# Patient Record
Sex: Female | Born: 1985 | Race: White | Hispanic: No | Smoking: Current some day smoker
Health system: Southern US, Community
[De-identification: ages and names within clinical notes are randomized; demographics above are authoritative.]

## PROBLEM LIST (undated history)

## (undated) DIAGNOSIS — IMO0002 Reserved for concepts with insufficient information to code with codable children: Secondary | ICD-10-CM

## (undated) DIAGNOSIS — F329 Major depressive disorder, single episode, unspecified: Secondary | ICD-10-CM

## (undated) HISTORY — PX: NO PAST SURGERIES: SHX2092

---
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
## 2012-06-15 ENCOUNTER — Observation Stay: Payer: Self-pay | Admitting: Obstetrics & Gynecology

## 2012-06-25 ENCOUNTER — Inpatient Hospital Stay: Payer: Self-pay

## 2012-06-25 LAB — CBC WITH DIFFERENTIAL/PLATELET
Basophil #: 0 10*3/uL (ref 0.0–0.1)
Eosinophil %: 0.8 %
HGB: 10.2 g/dL — ABNORMAL LOW (ref 12.0–16.0)
Lymphocyte %: 16.7 %
MCH: 26.9 pg (ref 26.0–34.0)
MCV: 78 fL — ABNORMAL LOW (ref 80–100)
Monocyte #: 1.1 x10 3/mm — ABNORMAL HIGH (ref 0.2–0.9)
Monocyte %: 7.1 %
Platelet: 256 10*3/uL (ref 150–440)
RBC: 3.8 10*6/uL (ref 3.80–5.20)
RDW: 14.7 % — ABNORMAL HIGH (ref 11.5–14.5)
WBC: 15.8 10*3/uL — ABNORMAL HIGH (ref 3.6–11.0)

## 2013-05-25 ENCOUNTER — Emergency Department: Payer: Self-pay | Admitting: Emergency Medicine

## 2013-10-23 LAB — HM PAP SMEAR: HM Pap smear: NEGATIVE

## 2014-05-03 ENCOUNTER — Emergency Department: Payer: Self-pay | Admitting: Emergency Medicine

## 2014-10-08 ENCOUNTER — Emergency Department: Payer: Self-pay | Admitting: Physician Assistant

## 2015-01-21 IMAGING — CT CT HEAD WITHOUT CONTRAST
1 series · 16 of 30 positions shown, 20 images · non-contrast
Comparison: None.

CLINICAL DATA: Facial spasm, numbness

EXAM:
CT HEAD WITHOUT CONTRAST
TECHNIQUE: Contiguous axial images were obtained from the base of the skull
through the vertex without intravenous contrast.

[Series 2: head wo · axial · 0.43mm/px · z∈[-145,-19]mm · 16 of 32 slices shown, 20 images]
[im 2/32  brain]
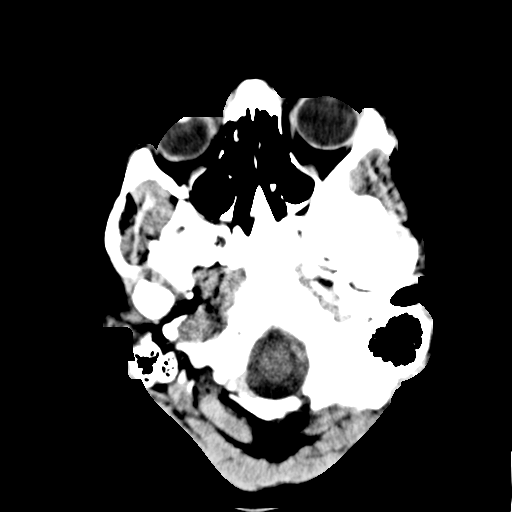
[im 2/32  bone]
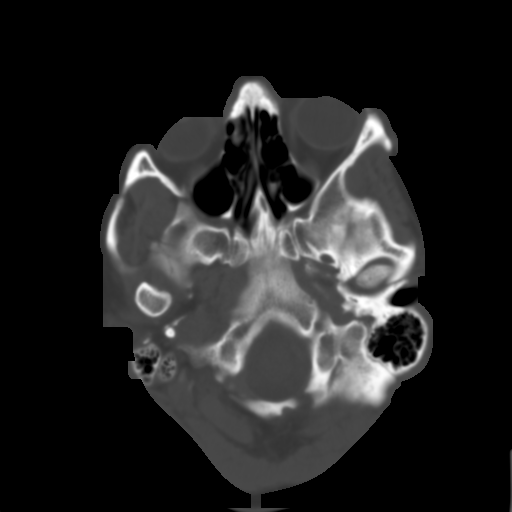
[im 4/32  brain]
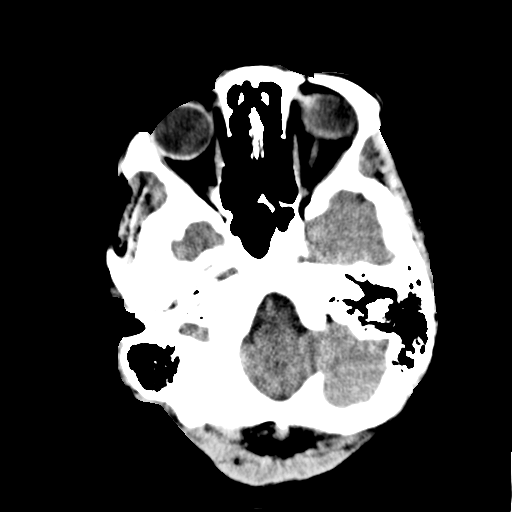
[im 6/32  brain]
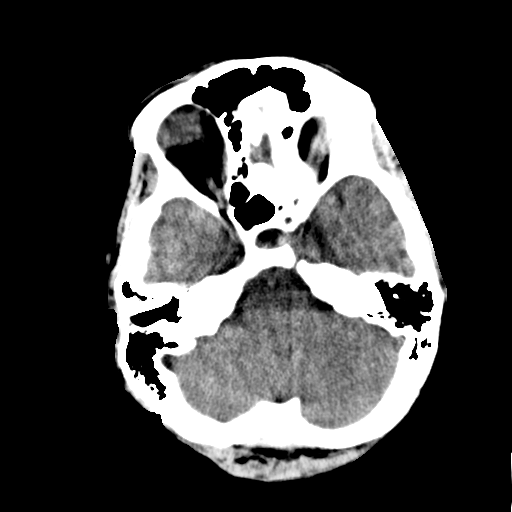
[im 8/32  brain]
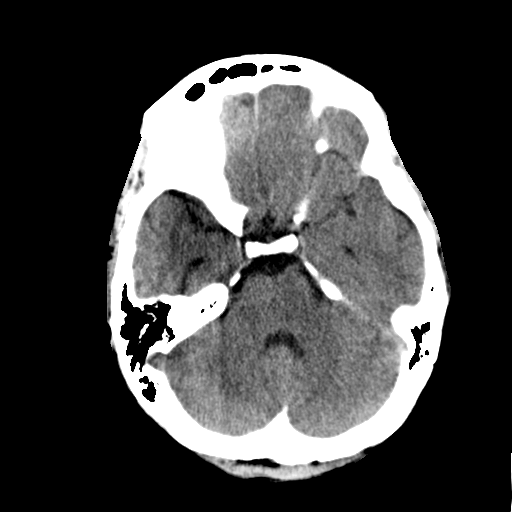
[im 9/32  brain]
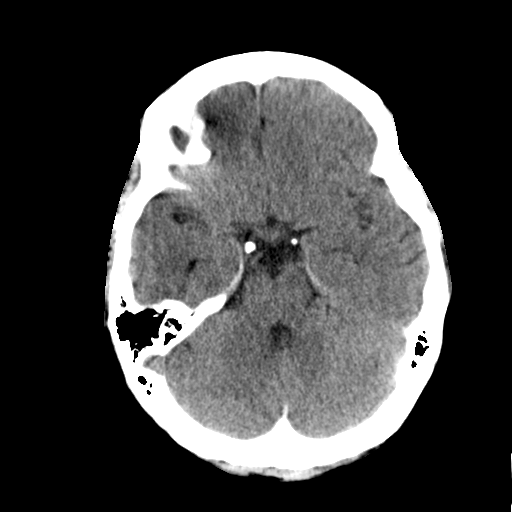
[im 9/32  bone]
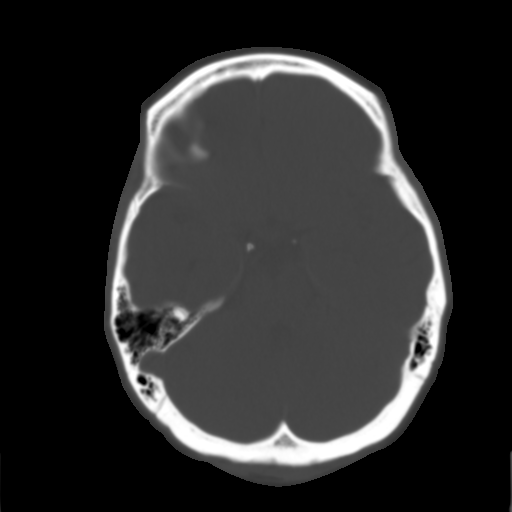
[im 11/32  brain]
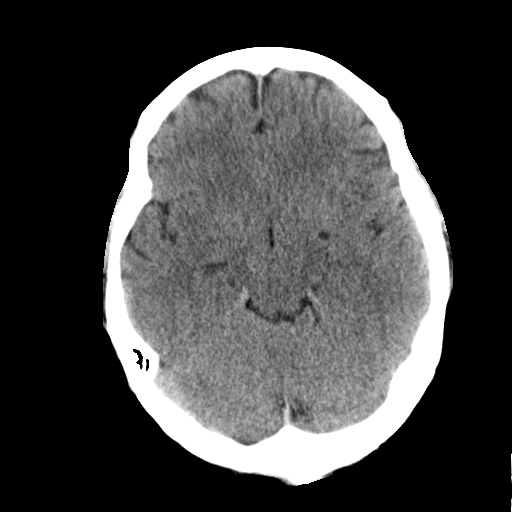
[im 13/32  brain]
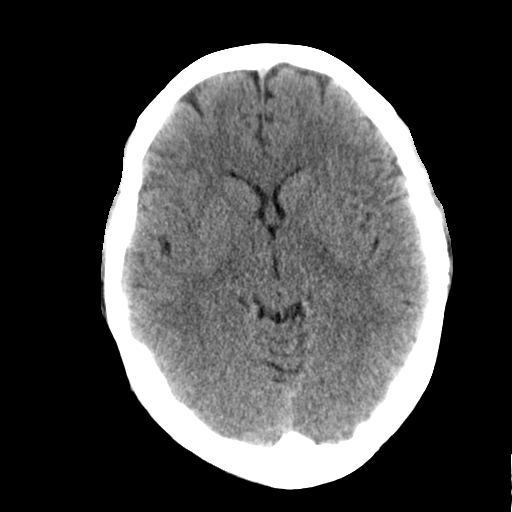
[im 15/32  brain]
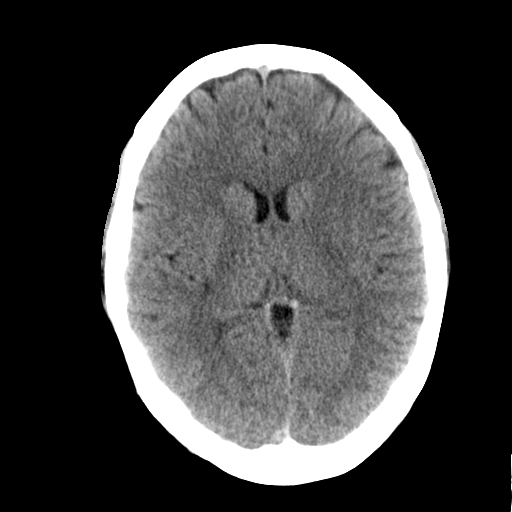
[im 17/32  brain]
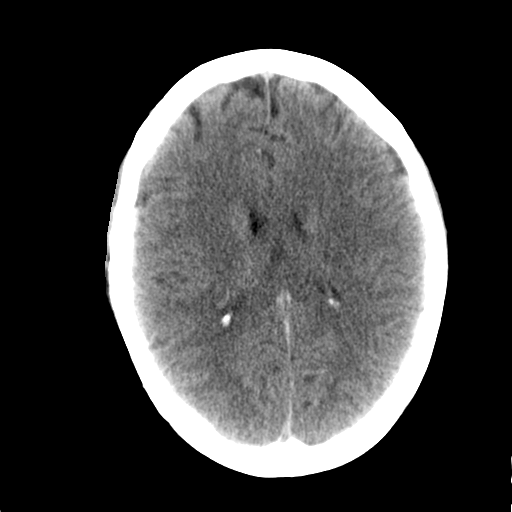
[im 17/32  bone]
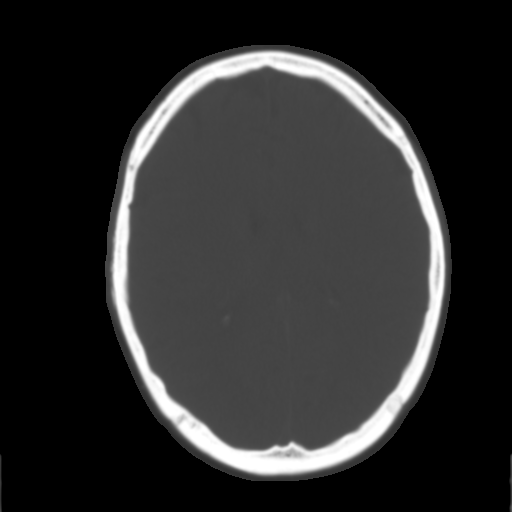
[im 19/32  brain]
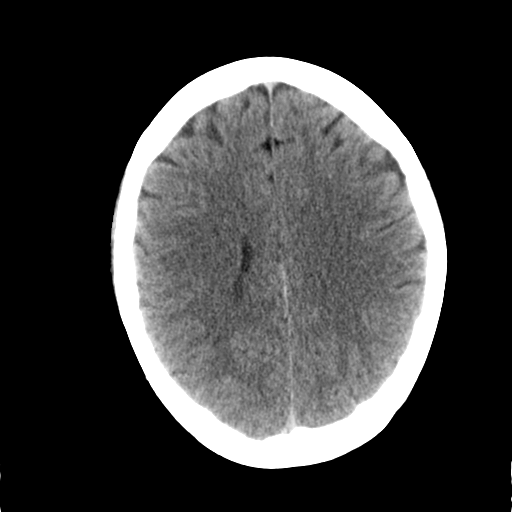
[im 21/32  brain]
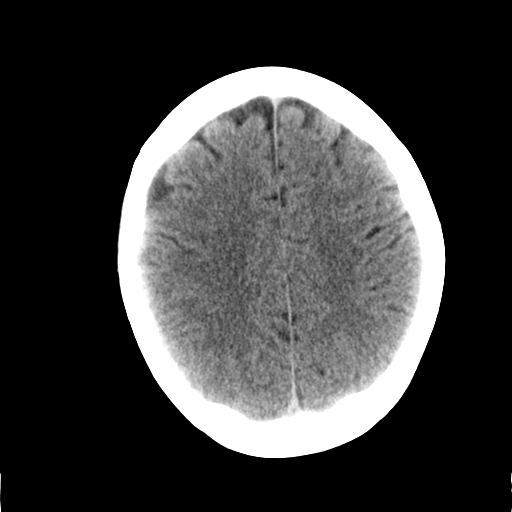
[im 23/32  brain]
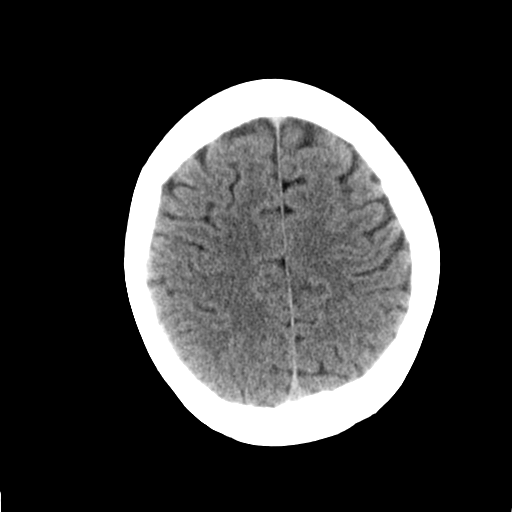
[im 24/32  brain]
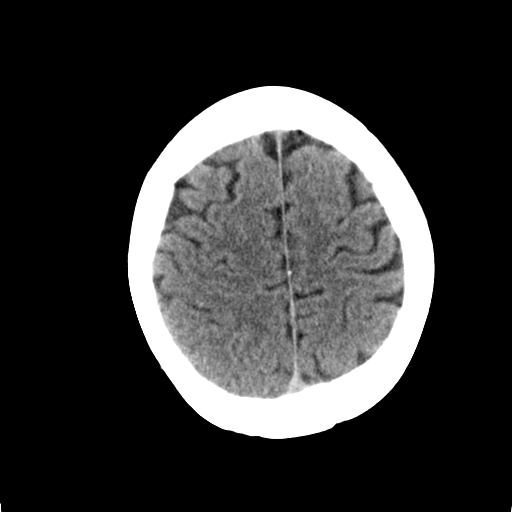
[im 24/32  bone]
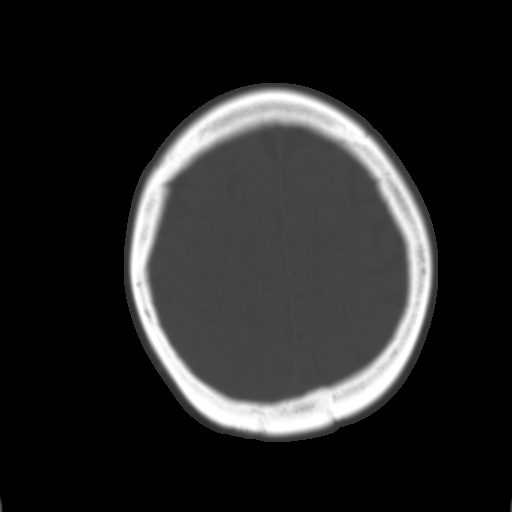
[im 26/32  brain]
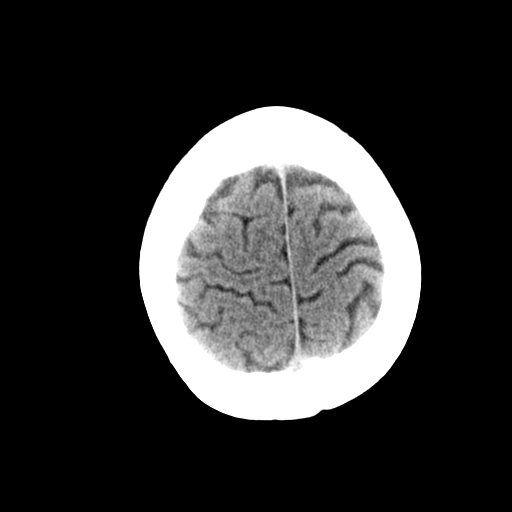
[im 28/32  brain]
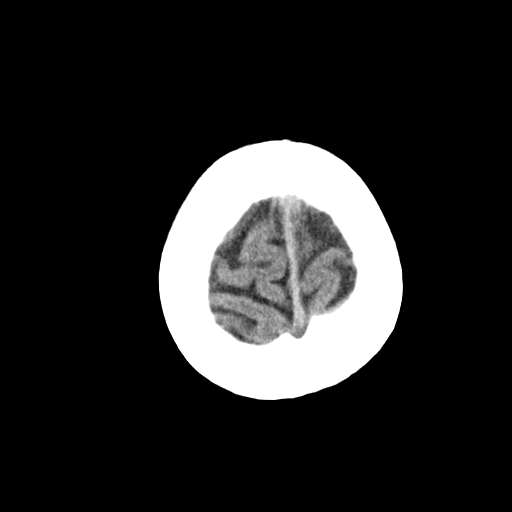
[im 30/32  brain]
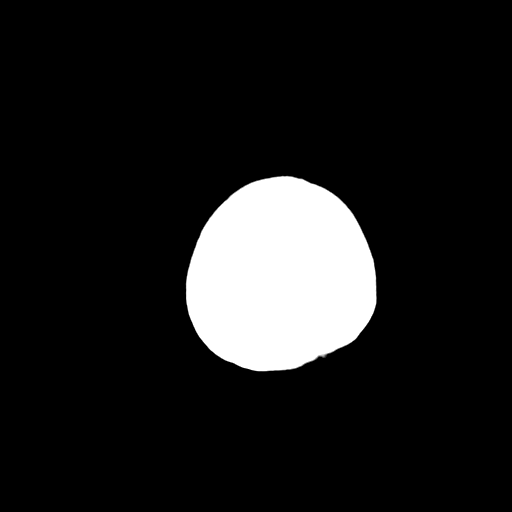

[16 of 30 positions shown; findings below may reference images not displayed]

FINDINGS: No skull fracture is noted. Paranasal sinuses and mastoid air cells
are unremarkable.

No intracranial hemorrhage, mass effect or midline shift.

No acute infarction. No mass lesion is noted on this unenhanced
scan. No hydrocephalus. The gray and white-matter differentiation is
preserved. No intra or extra-axial fluid collection.
IMPRESSION: No acute intracranial abnormality.

## 2015-01-30 NOTE — H&P (Signed)
L&D Evaluation:  History:   HPI 29yo G2P1001 @[redacted]w[redacted]d  by dates presents with c/o contractions for the past 12hrs ago.  2cm and membranes stripped in the office yesterday.  No LOF or VB.  +FM.  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  [facility] with [facility] @ 32w[facility].  PNL - O neg (Ab pos 04/28/12, s/p rhogram 04/14/12) HIV neg, VI, RI, Hgb 9.9, 1h GTT 117, GBS pos.    Presents with contractions    Patient's Medical History HSV    Patient's Surgical History none    Medications Pre Natal Vitamins  Valtrex    Allergies NKDA    Social History none    Family History Non-Contributory   ROS:   ROS All systems were reviewed.  HEENT, CNS, GI, GU, Respiratory, CV, Renal and Musculoskeletal systems were found to be normal.   Exam:   Vital Signs stable    General no apparent distress    Mental Status clear    Chest nl effort    Abdomen gravid, non-tender    Estimated Fetal Weight Average for gestational age    Fetal Position vtx    Pelvic 3 /70 / -2    Mebranes Intact    FHT normal rate with no decels, 145 moderate variability, + accels, no decels    Ucx irregular, every 3-2710min   Impression:   Impression early labor   Plan:   Plan monitor contractions and for cervical change   Electronic Signatures for Addendum Section:  Orvan FalconerStansbury Clipp, Kreston Ahrendt K (MD) (Signed Addendum 24-Sep-13 17:14)  Contractions spaced out, pt requesting d/c home.  Will f/u as scheduled next week.   Electronic Signatures: Garnette GunnerStansbury Clipp, Ali LoweEryn K (MD)  (Signed 24-Sep-13 16:30)  Authored: L&D Evaluation   Last Updated: 24-Sep-13 17:14 by Garnette GunnerStansbury Clipp, Ali LoweEryn K (MD)

## 2015-04-23 ENCOUNTER — Encounter: Payer: Self-pay | Admitting: Emergency Medicine

## 2015-04-23 ENCOUNTER — Emergency Department: Payer: Medicaid Other

## 2015-04-23 ENCOUNTER — Emergency Department
Admission: EM | Admit: 2015-04-23 | Discharge: 2015-04-23 | Disposition: A | Discharge status: Home / Self Care | Payer: Medicaid Other | Attending: Emergency Medicine | Admitting: Emergency Medicine

## 2015-04-23 DIAGNOSIS — O9989 Other specified diseases and conditions complicating pregnancy, childbirth and the puerperium: Secondary | ICD-10-CM | POA: Diagnosis present

## 2015-04-23 DIAGNOSIS — R197 Diarrhea, unspecified: Secondary | ICD-10-CM | POA: Diagnosis not present

## 2015-04-23 DIAGNOSIS — O200 Threatened abortion: Secondary | ICD-10-CM | POA: Insufficient documentation

## 2015-04-23 DIAGNOSIS — Z3A00 Weeks of gestation of pregnancy not specified: Secondary | ICD-10-CM | POA: Diagnosis not present

## 2015-04-23 DIAGNOSIS — O99331 Smoking (tobacco) complicating pregnancy, first trimester: Secondary | ICD-10-CM | POA: Diagnosis not present

## 2015-04-23 DIAGNOSIS — O210 Mild hyperemesis gravidarum: Secondary | ICD-10-CM | POA: Insufficient documentation

## 2015-04-23 DIAGNOSIS — F17210 Nicotine dependence, cigarettes, uncomplicated: Secondary | ICD-10-CM | POA: Insufficient documentation

## 2015-04-23 DIAGNOSIS — O219 Vomiting of pregnancy, unspecified: Secondary | ICD-10-CM

## 2015-04-23 LAB — COMPREHENSIVE METABOLIC PANEL
ALT: 25 U/L (ref 14–54)
ANION GAP: 8 (ref 5–15)
AST: 43 U/L — ABNORMAL HIGH (ref 15–41)
Albumin: 4.2 g/dL (ref 3.5–5.0)
Alkaline Phosphatase: 75 U/L (ref 38–126)
BUN: 7 mg/dL (ref 6–20)
CO2: 21 mmol/L — ABNORMAL LOW (ref 22–32)
CREATININE: 0.47 mg/dL (ref 0.44–1.00)
Calcium: 9 mg/dL (ref 8.9–10.3)
Chloride: 106 mmol/L (ref 101–111)
GFR calc Af Amer: >60 mL/min (ref >60)
Glucose, Bld: 72 mg/dL (ref 65–99)
Potassium: 3.7 mmol/L (ref 3.5–5.1)
Sodium: 135 mmol/L (ref 135–145)
Total Bilirubin: 0.3 mg/dL (ref 0.3–1.2)
Total Protein: 7.1 g/dL (ref 6.5–8.1)

## 2015-04-23 LAB — CBC
HEMATOCRIT: 41.7 % (ref 35.0–47.0)
Hemoglobin: 13.5 g/dL (ref 12.0–16.0)
MCH: 27.6 pg (ref 26.0–34.0)
MCHC: 32.5 g/dL (ref 32.0–36.0)
MCV: 85.2 fL (ref 80.0–100.0)
Platelets: 198 10*3/uL (ref 150–440)
RBC: 4.89 MIL/uL (ref 3.80–5.20)
RDW: 15.1 % — AB (ref 11.5–14.5)
WBC: 8.4 10*3/uL (ref 3.6–11.0)

## 2015-04-23 LAB — URINALYSIS COMPLETE WITH MICROSCOPIC (ARMC ONLY)
BILIRUBIN URINE: NEGATIVE
Bacteria, UA: NONE SEEN
GLUCOSE, UA: NEGATIVE mg/dL
Hgb urine dipstick: NEGATIVE
Ketones, ur: NEGATIVE mg/dL
Leukocytes, UA: NEGATIVE
NITRITE: NEGATIVE
PH: 5 (ref 5.0–8.0)
Protein, ur: NEGATIVE mg/dL
SPECIFIC GRAVITY, URINE: 1.019 (ref 1.005–1.030)

## 2015-04-23 LAB — TYPE AND SCREEN
ABO/RH(D): O NEG
ANTIBODY SCREEN: NEGATIVE

## 2015-04-23 LAB — ABO/RH: ABO/RH(D): O NEG

## 2015-04-23 LAB — HCG, QUANTITATIVE, PREGNANCY: hCG, Beta Chain, Quant, S: 7377 m[IU]/mL — ABNORMAL HIGH (ref <5)

## 2015-04-23 LAB — PREGNANCY, URINE: PREG TEST UR: POSITIVE — AB

## 2015-04-23 MED ORDER — ONDANSETRON HCL 4 MG/2ML IJ SOLN: 4.0000 mg | Freq: Once | INTRAMUSCULAR | Status: DC

## 2015-04-23 MED ORDER — RHO D IMMUNE GLOBULIN 1500 UNIT/2ML IJ SOSY
300.0000 ug | PREFILLED_SYRINGE | Freq: Once | INTRAMUSCULAR | Status: AC
  Administered 2015-04-23: 300 ug via INTRAMUSCULAR
  Filled 2015-04-23: qty 2

## 2015-04-23 MED ORDER — SODIUM CHLORIDE 0.9 % IV BOLUS (SEPSIS)
1000.0000 mL | Freq: Once | INTRAVENOUS | Status: AC
Start: 2015-04-23 — End: 2015-04-23
  Administered 2015-04-23: 1000 mL via INTRAVENOUS

## 2015-04-23 NOTE — ED Notes (Signed)
Called lab re: ABO/RH still has 15 minutes left to run.

## 2015-04-23 NOTE — ED Notes (Signed)
Patient transported to Ultrasound 

## 2015-04-23 NOTE — ED Notes (Signed)
Patient in ultrasound at this time.

## 2015-04-23 NOTE — Discharge Instructions (Signed)
You have been seen in the emergency department today for nausea, vomiting, diarrhea, and vaginal spotting during her pregnancy. Her ultrasound shows a 6 week intrauterine pregnancy. Please drink plenty of fluids at home, follow up with your OB/GYN as soon as possible. You have been dosed RhoGAM in the emergency department. Return to the emergency department for any pain, fever, or increased vaginal bleeding.     Morning Sickness Morning sickness is when you feel sick to your stomach (nauseous) during pregnancy. This nauseous feeling may or may not come with vomiting. It often occurs in the morning but can be a problem any time of day. Morning sickness is most common during the first trimester, but it may continue throughout pregnancy. While morning sickness is unpleasant, it is usually harmless unless you develop severe and continual vomiting (hyperemesis gravidarum). This condition requires more intense treatment.  CAUSES  The cause of morning sickness is not completely known but seems to be related to normal hormonal changes that occur in pregnancy. RISK FACTORS You are at greater risk if you:  Experienced nausea or vomiting before your pregnancy.  Had morning sickness during a previous pregnancy.  Are pregnant with more than one baby, such as twins. TREATMENT  Do not use any medicines (prescription, over-the-counter, or herbal) for morning sickness without first talking to your health care provider. Your health care provider may prescribe or recommend:  Vitamin B6 supplements.  Anti-nausea medicines.  The herbal medicine ginger. HOME CARE INSTRUCTIONS   Only take over-the-counter or prescription medicines as directed by your health care provider.  Taking multivitamins before getting pregnant can prevent or decrease the severity of morning sickness in most women.  Eat a piece of dry toast or unsalted crackers before getting out of bed in the morning.  Eat five or six small meals a  day.  Eat dry and bland foods (rice, baked potato). Foods high in carbohydrates are often helpful.  Do not drink liquids with your meals. Drink liquids between meals.  Avoid greasy, fatty, and spicy foods.  Get someone to cook for you if the smell of any food causes nausea and vomiting.  If you feel nauseous after taking prenatal vitamins, take the vitamins at night or with a snack.  Snack on protein foods (nuts, yogurt, cheese) between meals if you are hungry.  Eat unsweetened gelatins for desserts.  Wearing an acupressure wristband (worn for sea sickness) may be helpful.  Acupuncture may be helpful.  Do not smoke.  Get a humidifier to keep the air in your house free of odors.  Get plenty of fresh air. SEEK MEDICAL CARE IF:   Your home remedies are not working, and you need medicine.  You feel dizzy or lightheaded.  You are losing weight. SEEK IMMEDIATE MEDICAL CARE IF:   You have persistent and uncontrolled nausea and vomiting.  You pass out (faint). MAKE SURE YOU:  Understand these instructions.  Will watch your condition.  Will get help right away if you are not doing well or get worse. Document Released: 10/30/2006 Document Revised: 09/13/2013 Document Reviewed: 02/23/2013 [facility] Patient Information 2015 [place], [place]. This information is not intended to replace advice given to you by your health care provider. Make sure you discuss any questions you have with your health care provider.  Threatened Miscarriage A threatened miscarriage is when you have vaginal bleeding during your first 20 weeks of pregnancy but the pregnancy has not ended. Your doctor will do tests to make sure you are still  pregnant. The cause of the bleeding may not be known. This condition does not mean your pregnancy will end. It does increase the risk of it ending (complete miscarriage). HOME CARE   Make sure you keep all your doctor visits for prenatal care.  Get plenty of  rest.  Do not have sex or use tampons if you have vaginal bleeding.  Do not douche.  Do not smoke or use drugs.  Do not drink alcohol.  Avoid caffeine. GET HELP IF:  You have light bleeding from your vagina.  You have belly pain or cramping.  You have a fever. GET HELP RIGHT AWAY IF:   You have heavy bleeding from your vagina.  You have clots of blood coming from your vagina.  You have bad pain or cramps in your low back or belly.  You have fever, chills, and bad belly pain. MAKE SURE YOU:   Understand these instructions.  Will watch your condition.  Will get help right away if you are not doing well or get worse. Document Released: 08/21/2008 Document Revised: 09/13/2013 Document Reviewed: 07/05/2013 [facility] Patient Information 2015 [place], [place]. This information is not intended to replace advice given to you by your health care provider. Make sure you discuss any questions you have with your health care provider.

## 2015-04-23 NOTE — ED Notes (Signed)
Returned from ultrasound.

## 2015-04-23 NOTE — ED Notes (Signed)
Pt arrives with complaints of diaherra, stomach cramps, and nausea, pt states she is exactly 1 month pregnant, pt states some spotting vaginally, pt states she is unable to keep anything down, pt alert during assessment in no acute distress

## 2015-04-23 NOTE — ED Provider Notes (Signed)
[facility] Emergency Department Provider Note  Time seen: 9:18 AM  I have reviewed the triage vital signs and the nursing notes.   HISTORY  Chief Complaint Abdominal Pain    HPI Penny Zimmerman is a 29 y.o. female who is approximately 1-2 months pregnant presents the emergency department for lower abdominal pain, nausea, vomiting, diarrhea. According to the patient for the past 2 weeks she has had intermittent diarrhea, along with lower abdominal cramps/pain, persistent nausea with intermittent vomiting. She also states 2 days ago she had vaginal spotting which is since resolved. Denies any vaginal discharge. Patient's main complaint is not being able to keep anything down, feels dehydrated. Describes her nausea as moderate, diarrhea as moderate. Denies fever. Patient has not yet seen her OB.      History reviewed. No pertinent past medical history.  There are no active problems to display for this patient.   History reviewed. No pertinent past surgical history.  No current outpatient prescriptions on file.  Allergies Review of patient's allergies indicates no known allergies.  No family history on file.  Social History History  Substance Use Topics  . Smoking status: Current Some Day Smoker  . Smokeless tobacco: Not on file  . Alcohol Use: No    Review of Systems Constitutional: Negative for fever. Cardiovascular: Negative for chest pain. Respiratory: Negative for shortness of breath. Gastrointestinal: Positive for lower abdominal pain/cramping. Positive for nausea, vomiting, diarrhea. Skin: Negative for rash. Neurological: Negative for headache 10-point ROS otherwise negative.  ____________________________________________   PHYSICAL EXAM:  VITAL SIGNS: ED Triage Vitals  Enc Vitals Group     BP 04/23/15 0823 129/66 mmHg     Pulse Rate 04/23/15 0823 86     Resp 04/23/15 0823 18     Temp 04/23/15 0823 98.4 F (36.9 C)     Temp Source  04/23/15 0823 Oral     SpO2 04/23/15 0823 100 %     Weight 04/23/15 0823 180 lb (81.647 kg)     Height 04/23/15 0823  (1.727 m)     Head Cir --      Peak Flow --      Pain Score 04/23/15 0823 9     Pain Loc --      Pain Edu? --      Excl. in GC? --     Constitutional: Alert and oriented. Well appearing and in no distress. Eyes: Normal exam ENT   Mouth/Throat: Mucous membranes are moist. Cardiovascular: Normal rate, regular rhythm. No murmur Respiratory: Normal respiratory effort without tachypnea nor retractions. Breath sounds are clear and equal bilaterally. No wheezes/rales/rhonchi. Gastrointestinal: Mild lower abdominal tenderness palpation. No rebound or guarding. No distention. Musculoskeletal: Nontender with normal range of motion in all extremities Neurologic:  Normal speech and language. No gross focal neurologic deficits Skin:  Skin is warm, dry and intact.  Psychiatric: Mood and affect are normal. Speech and behavior are normal.   ____________________________________________   RADIOLOGY  Ultrasound consistent with single IUP at 6 weeks. Heart rate of 100 bpm.  ____________________________________________    INITIAL IMPRESSION / ASSESSMENT AND PLAN / ED COURSE  Pertinent labs & imaging results that were available during my care of the patient were reviewed by me and considered in my medical decision making (see chart for details).  Patient with 2 weeks of diarrhea, nausea, vomiting. She is approximately 1-2 months pregnant at this time. Also with vaginal spotting 2-3 days he have. We'll proceed with workup  including beta hCG, ultrasound, type and screen, IV hydration. I discussed using nausea medication such as Zofran or Phenergan, the patient does not feel comfortable taking these medications at this time, and would rather discuss this with her OB/GYN first.   Ultrasound consistent with early pregnancy at 6 weeks. We will discharge patient home with OB  follow-up. The patient does not wish for nausea medication going home. Patient is O- we will dose RhoGAM before going home.  ____________________________________________   FINAL CLINICAL IMPRESSION(S) / ED DIAGNOSES  Nausea and vomiting First trimester pregnancy Diarrhea Threatened miscarriage   Minna Antis, MD 04/23/15 [phone]

## 2015-04-23 NOTE — ED Notes (Signed)
Pt presents with abd pain and vomiting. Pt states she is app one mth pregnant. No vomiting noted in triage.

## 2015-04-24 LAB — RHOGAM INJECTION: Unit division: 0

## 2015-05-11 ENCOUNTER — Ambulatory Visit (INDEPENDENT_AMBULATORY_CARE_PROVIDER_SITE_OTHER): Payer: Medicaid Other | Admitting: Obstetrics and Gynecology

## 2015-05-11 VITALS — BP 110/71 | HR 75 | Wt 185.2 lb

## 2015-05-11 DIAGNOSIS — O360111 Maternal care for anti-D [Rh] antibodies, first trimester, fetus 1: Secondary | ICD-10-CM

## 2015-05-11 DIAGNOSIS — Z369 Encounter for antenatal screening, unspecified: Secondary | ICD-10-CM

## 2015-05-11 DIAGNOSIS — Z36 Encounter for antenatal screening of mother: Secondary | ICD-10-CM

## 2015-05-11 DIAGNOSIS — Z331 Pregnant state, incidental: Secondary | ICD-10-CM

## 2015-05-11 DIAGNOSIS — R638 Other symptoms and signs concerning food and fluid intake: Secondary | ICD-10-CM

## 2015-05-11 DIAGNOSIS — Z113 Encounter for screening for infections with a predominantly sexual mode of transmission: Secondary | ICD-10-CM

## 2015-05-11 DIAGNOSIS — Z1389 Encounter for screening for other disorder: Secondary | ICD-10-CM

## 2015-05-11 NOTE — Progress Notes (Signed)
Penny Zimmerman for NOB nurse interview visit. G-3.  P-2002. Pt seen in ER on 04/23/2015 for spotting and was given Rhogam injection. Also had dehydration from n/v but pt states this has improved and does not want anything for n/v. Pregnancy eduction material explained and given. No cats in the home. NOB labs ordered. TSH/HbgA1c due to Increased BMI, HIV and drug informed that she could opt out but did not. Drug screen sent to lab. PNV encouraged. NT to discuss with provider. Pt states she does have bad teeth and will need dental work.  Pt. To follow up with provider in 2 weeks for NOB physical.  All questions answered.  ZIKA EXPOSURE SCREEN:  The patient has not traveled to a [place] Virus endemic area within the past 6 months, nor has she had unprotected sex with a partner who has travelled to a [place] endemic region within the past 6 months. The patient has been advised to notify us if these factors change any time during this current pregnancy, so adequate testing and monitoring can be initiated.

## 2015-05-12 DIAGNOSIS — O26899 Other specified pregnancy related conditions, unspecified trimester: Secondary | ICD-10-CM | POA: Insufficient documentation

## 2015-05-12 DIAGNOSIS — Z6791 Unspecified blood type, Rh negative: Secondary | ICD-10-CM

## 2015-05-12 LAB — CBC WITH DIFFERENTIAL/PLATELET
Basophils Absolute: 0 10*3/uL (ref 0.0–0.2)
Basos: 0 %
EOS (ABSOLUTE): 0.1 10*3/uL (ref 0.0–0.4)
EOS: 2 %
HEMATOCRIT: 37.5 % (ref 34.0–46.6)
Hemoglobin: 12.8 g/dL (ref 11.1–15.9)
IMMATURE GRANS (ABS): 0 10*3/uL (ref 0.0–0.1)
IMMATURE GRANULOCYTES: 0 %
Lymphocytes Absolute: 2.1 10*3/uL (ref 0.7–3.1)
Lymphs: 28 %
MCH: 27.9 pg (ref 26.6–33.0)
MCHC: 34.1 g/dL (ref 31.5–35.7)
MCV: 82 fL (ref 79–97)
MONOS ABS: 0.6 10*3/uL (ref 0.1–0.9)
Monocytes: 8 %
NEUTROS PCT: 62 %
Neutrophils Absolute: 4.7 10*3/uL (ref 1.4–7.0)
Platelets: 240 10*3/uL (ref 150–379)
RBC: 4.58 x10E6/uL (ref 3.77–5.28)
RDW: 14.7 % (ref 12.3–15.4)
WBC: 7.6 10*3/uL (ref 3.4–10.8)

## 2015-05-12 LAB — URINALYSIS, ROUTINE W REFLEX MICROSCOPIC
Bilirubin, UA: NEGATIVE
Glucose, UA: NEGATIVE
KETONES UA: NEGATIVE
Leukocytes, UA: NEGATIVE
NITRITE UA: NEGATIVE
PH UA: 7.5 (ref 5.0–7.5)
Protein, UA: NEGATIVE
RBC, UA: NEGATIVE
SPEC GRAV UA: 1.006 (ref 1.005–1.030)
Urobilinogen, Ur: 0.2 mg/dL (ref 0.2–1.0)

## 2015-05-12 LAB — HEPATITIS B SURFACE ANTIGEN: Hepatitis B Surface Ag: NEGATIVE

## 2015-05-12 LAB — HEMOGLOBIN A1C
Est. average glucose Bld gHb Est-mCnc: 111 mg/dL
HEMOGLOBIN A1C: 5.5 % (ref 4.8–5.6)

## 2015-05-12 LAB — RPR: RPR: NONREACTIVE

## 2015-05-12 LAB — RUBELLA ANTIBODY, IGM: Rubella IgM: <20 AU/mL (ref 0.0–19.9)

## 2015-05-12 LAB — TSH: TSH: 1.04 u[IU]/mL (ref 0.450–4.500)

## 2015-05-12 LAB — HIV ANTIBODY (ROUTINE TESTING W REFLEX): HIV SCREEN 4TH GENERATION: NONREACTIVE

## 2015-05-12 NOTE — Progress Notes (Signed)
I have reviewed the record and concur with patient management and plan.  Abbegail Matuska, MD [facility]  

## 2015-05-13 LAB — GC/CHLAMYDIA PROBE AMP
Chlamydia trachomatis, NAA: NEGATIVE
NEISSERIA GONORRHOEAE BY PCR: NEGATIVE

## 2015-05-13 LAB — URINE CULTURE

## 2015-05-14 LAB — ANTIBODY SCREEN

## 2015-05-14 LAB — AB SCR+ANTIBODY ID: ANTIBODY SCREEN: POSITIVE — AB

## 2015-05-15 LAB — VARICELLA ZOSTER ANTIBODY, IGM: VARICELLA IGM: 0.98 {index} — AB (ref 0.00–0.90)

## 2015-05-21 ENCOUNTER — Encounter: Payer: Self-pay | Admitting: Obstetrics and Gynecology

## 2015-05-29 ENCOUNTER — Ambulatory Visit (INDEPENDENT_AMBULATORY_CARE_PROVIDER_SITE_OTHER): Payer: Medicaid Other | Admitting: Obstetrics and Gynecology

## 2015-05-29 ENCOUNTER — Encounter: Payer: Self-pay | Admitting: Obstetrics and Gynecology

## 2015-05-29 VITALS — BP 111/68 | HR 87 | Wt 187.5 lb

## 2015-05-29 DIAGNOSIS — O360111 Maternal care for anti-D [Rh] antibodies, first trimester, fetus 1: Secondary | ICD-10-CM

## 2015-05-29 DIAGNOSIS — Z3481 Encounter for supervision of other normal pregnancy, first trimester: Secondary | ICD-10-CM | POA: Diagnosis not present

## 2015-05-29 DIAGNOSIS — N898 Other specified noninflammatory disorders of vagina: Secondary | ICD-10-CM

## 2015-05-29 DIAGNOSIS — Z3491 Encounter for supervision of normal pregnancy, unspecified, first trimester: Secondary | ICD-10-CM

## 2015-05-29 DIAGNOSIS — Z3493 Encounter for supervision of normal pregnancy, unspecified, third trimester: Secondary | ICD-10-CM | POA: Insufficient documentation

## 2015-05-29 LAB — POCT URINALYSIS DIPSTICK
Bilirubin, UA: NEGATIVE
Glucose, UA: NEGATIVE
KETONES UA: NEGATIVE
Leukocytes, UA: NEGATIVE
Nitrite, UA: NEGATIVE
PH UA: 7.5
Protein, UA: NEGATIVE
Spec Grav, UA: <=1.005
Urobilinogen, UA: NEGATIVE

## 2015-05-29 NOTE — Progress Notes (Signed)
Subjective:    Penny Zimmerman is being seen today for her first obstetrical visit.  This is a planned pregnancy. She is a 29 y.o. G100P2002 female at 68w1dgestation. Patient's last menstrual period was 02/25/2015 (approximate). Estimated Date of Delivery: 12/17/15 (by 6 week ER sono consistent with dates). Her obstetrical history is significant for smoker and Rh negative status. Patient was seen in the ER at [redacted] weeks gestation for vaginal spotting, given dose of Rhogam. Relationship with FOB: spouse, living together. Patient does intend to breast feed. Pregnancy history fully reviewed.  Menstrual History: Obstetric History   G3   P2   T2   P0   A0   TAB0   SAB0   E0   M0   L2     # Outcome Date GA Lbr Len/2nd Weight Sex Delivery Anes PTL Lv  3 Current           2 Term 04/26/12   8 lb 2 oz (3.685 kg) M Vag-Spont   Y  1 Term 04/24/10   7 lb 3 oz (3.26 kg) M Vag-Spont   Y     Menarche age: [age]'s last menstrual period was 02/25/2015 (approximate).  Denies h/o STis or abnormal pap smears.  Last pap was unsure (possibly last year).    History reviewed. No pertinent past medical history.   Past Surgical History  Procedure Laterality Date  . No past surgeries      Family History  Problem Relation Age of Onset  . Diabetes Father   . Diabetes Paternal Grandmother   . Hypertension Father   . Hyperlipidemia Father      Social History   Social History  . Marital Status: Divorced    Spouse Name: N/A  . Number of Children: N/A  . 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 Smoking status: Current Some Day Smoker  . Smokeless tobacco: Never Used  . Alcohol Use: No  . Drug Use: No  . Sexual Activity:    Partners: Male   Other Topics Concern  . Not on file   Social History Narrative   Current Outpatient Prescriptions on File Prior to Visit  Medication Sig Dispense Refill  . acetaminophen (TYLENOL) 325 MG tablet Take  650 mg by mouth every 6 (six) hours as needed.    . Prenatal Vit-Fe Fumarate-FA (PRENATAL MULTIVITAMIN) TABS tablet Take 1 tablet by mouth daily at 12 noon.     No current facility-administered medications on file prior to visit.   No Known Allergies   Review of Systems General:Not Present- Fever, Weight Loss and Weight Gain. Skin:Not Present- Rash. HEENT:Not Present- Blurred Vision, Headache and Bleeding Gums. Respiratory:Not Present- Difficulty Breathing. Breast:Not Present- Breast Mass. Cardiovascular:Not Present- Chest Pain, Elevated Blood Pressure, Fainting / Blacking Out and Shortness of Breath. Gastrointestinal:Present - Nausea and Vomiting (mild). Not Present- Abdominal Pain, Constipation. Female Genitourinary:Not Present- Frequency, Painful Urination, Pelvic Pain, Vaginal Bleeding, Vaginal Discharge, Contractions, regular, Fetal Movements Decreased, Urinary Complaints and Vaginal Fluid. Musculoskeletal:Not Present- Back Pain and Leg Cramps. Neurological:Not Present- Dizziness. Psychiatric:Not Present- Depression.     Objective:  Blood pressure 111/68, pulse 87, weight 187 lb 8 oz (85.049 kg), last menstrual period 02/25/2015.  General Appearance:    Alert, cooperative, no distress, appears stated age  Head:    Normocephalic, without obvious abnormality, atraumatic  Eyes:    PERRL, conjunctiva/corneas clear, EOM's intact,  both eyes  Ears:    Normal external ear canals, both ears  Nose:   Nares normal, septum midline, mucosa normal, no drainage or sinus tenderness  Throat:   Lips, mucosa, and tongue normal; teeth and gums normal  Neck:   Supple, symmetrical, trachea midline, no adenopathy; thyroid: no enlargement/tenderness/nodules; no carotid bruit or JVD  Back:     Symmetric, no curvature, ROM normal, no CVA tenderness  Lungs:     Clear to auscultation bilaterally, respirations unlabored  Chest Wall:    No tenderness or deformity   Heart:    Regular rate and  rhythm, S1 and S2 normal, no murmur, rub or gallop  Breast Exam:    No tenderness, masses, or nipple abnormality  Abdomen:     Soft, non-tender, bowel sounds active all four quadrants, no masses, no organomegaly.  FHT 176  bpm.  Genitalia:    Pelvic:external genitalia normal; vagina without lesions, discharge, or tenderness; rectovaginal septum  normal. Cervix normal in appearance, no cervical motion tenderness; no adnexal masses or tenderness.  Pregnancy positive findings: uterine enlargement: 12 wk size, nontender.   Rectal:    Normal external sphincter.  No hemorrhoids appreciated. Internal exam not done.   Extremities:   Extremities normal, atraumatic, no cyanosis or edema  Pulses:   2+ and symmetric all extremities  Skin:   Skin color, texture, turgor normal, no rashes or lesions  Lymph nodes:   Cervical, supraclavicular, and axillary nodes normal  Neurologic:   CNII-XII intact, normal strength, sensation and reflexes throughout    Assessment:    Pregnancy at 11 and 1/7 weeks   Rh negative status in pregnancy Rubella non-immune Excessive weight gain in 1st trimester (12 lbs)   Plan:    Initial labs reviewed.  Pap smear performed.  Will need MMR vaccine postpartum.  Will need Rhogam at [redacted] weeks gestation.  Prenatal vitamins. Problem list reviewed and updated. Prenatal testing, optional genetic testing discussed. AFP3 discussed: declined. Role of ultrasound in pregnancy discussed; fetal survey: discussed, will order at appropriate gestational age.  New OB counseling: The patient has been given an overview regarding routine prenatal care. Recommendations regarding diet, weight gain, and exercise in pregnancy were given. Benefits of Breast Feeding were discussed. The patient is encouraged to consider nursing her baby post partum. Follow up in 4 weeks.   Rubie Maid, MD [facility]

## 2015-06-03 LAB — PAP IG, CT-NG, RFX HPV ASCU: PAP SMEAR COMMENT: 0

## 2015-06-04 ENCOUNTER — Telehealth: Payer: Self-pay

## 2015-06-04 LAB — NUSWAB VAGINITIS PLUS (VG+)
BVAB 2: HIGH {score} — AB
Megasphaera 1: HIGH Score — AB

## 2015-06-04 NOTE — Telephone Encounter (Signed)
Called pt, LM for her to call back

## 2015-06-04 NOTE — Telephone Encounter (Signed)
-----   Message from Anika Cherry, MD sent at 06/04/2015 11:05 AM EDT ----- Please inform patient of high grade pap smear.  Will need to be scheduled for colposcopy. Reassure patient this ok to perform during pregnancy if she asks, and send pamphlet on colposcopy procedure. 

## 2015-06-04 NOTE — Telephone Encounter (Signed)
Pt calls back, advised her that she needs colop. PT scheduled. Pt education mailed.

## 2015-06-05 ENCOUNTER — Telehealth: Payer: Self-pay

## 2015-06-05 NOTE — Telephone Encounter (Signed)
-----   Message from Hildred Laser, MD sent at 06/04/2015 11:05 AM EDT ----- Please inform patient of high grade pap smear.  Will need to be scheduled for colposcopy. Reassure patient this ok to perform during pregnancy if she asks, and send pamphlet on colposcopy procedure.

## 2015-06-05 NOTE — Telephone Encounter (Signed)
Pt informed of abnormal PAP and the need for colposcopy. PT education mailed. PT scheduled for colpo.

## 2015-06-12 ENCOUNTER — Ambulatory Visit (INDEPENDENT_AMBULATORY_CARE_PROVIDER_SITE_OTHER): Payer: Medicaid Other | Admitting: Obstetrics and Gynecology

## 2015-06-12 ENCOUNTER — Encounter: Payer: Self-pay | Admitting: Obstetrics and Gynecology

## 2015-06-12 VITALS — BP 132/79 | HR 89 | Wt 187.5 lb

## 2015-06-12 DIAGNOSIS — R87619 Unspecified abnormal cytological findings in specimens from cervix uteri: Secondary | ICD-10-CM

## 2015-06-12 DIAGNOSIS — IMO0002 Reserved for concepts with insufficient information to code with codable children: Secondary | ICD-10-CM | POA: Insufficient documentation

## 2015-06-12 DIAGNOSIS — O3441 Maternal care for other abnormalities of cervix, first trimester: Secondary | ICD-10-CM | POA: Insufficient documentation

## 2015-06-12 DIAGNOSIS — Z3492 Encounter for supervision of normal pregnancy, unspecified, second trimester: Secondary | ICD-10-CM

## 2015-06-12 DIAGNOSIS — R87613 High grade squamous intraepithelial lesion on cytologic smear of cervix (HGSIL): Secondary | ICD-10-CM

## 2015-06-12 HISTORY — DX: Reserved for concepts with insufficient information to code with codable children: IMO0002

## 2015-06-12 LAB — POCT URINALYSIS DIPSTICK
BILIRUBIN UA: NEGATIVE
Glucose, UA: NEGATIVE
Ketones, UA: NEGATIVE
Leukocytes, UA: NEGATIVE
Nitrite, UA: NEGATIVE
PH UA: 6
PROTEIN UA: NEGATIVE
RBC UA: NEGATIVE
Spec Grav, UA: 1.02
Urobilinogen, UA: NEGATIVE

## 2015-06-12 NOTE — Progress Notes (Signed)
Pt here for colposcopy for abnormal pap.

## 2015-06-12 NOTE — Progress Notes (Signed)
    GYNECOLOGY CLINIC COLPOSCOPY PROCEDURE NOTE  29 y.o. 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 Discussed role for HPV in cervical dysplasia, need for surveillance.  Patient is currently 13.[redacted] weeks pregnant.   Patient given verbal informed consent.  Placed in lithotomy position. Cervix viewed with speculum and colposcope after application of acetic acid.   Colposcopy adequate? Yes  no visible lesions and no abnormal vasculature; Multiple nabothian cysts present with mucus. No biopsies obtained.  ECC specimen obtained with cytobrush. Specimen were labelled and sent to pathology.  Patient was given post procedure instructions.  Will follow up pathology and manage accordingly.  Routine preventative health maintenance measures emphasized.  Will repeat colposcopy again postpartum.      Hildred Laser, MD [facility]

## 2015-06-14 ENCOUNTER — Telehealth: Payer: Self-pay

## 2015-06-14 NOTE — Telephone Encounter (Signed)
Pt had returned call early and I was with a patient. Left message to return call.

## 2015-06-14 NOTE — Telephone Encounter (Signed)
Pt calling stating her legs feel like jello, dizzy. She ate a banana before she went to work. Pt denies pain. Having a little vaginal spotting from exam (colposcopy a few days ago). I encouraged pt to eat carbs/protein, significant breakfast or snack and drink a coke that possibly this is due to her not eating enough and not enough fluids. Also enouraged her to lie down and to call me back in or so to let me know how she is feeling.

## 2015-06-15 LAB — PATHOLOGY

## 2015-06-21 ENCOUNTER — Telehealth: Payer: Self-pay | Admitting: Obstetrics and Gynecology

## 2015-06-21 ENCOUNTER — Telehealth: Payer: Self-pay

## 2015-06-21 MED ORDER — METRONIDAZOLE 500 MG PO TABS: 500.0000 mg | ORAL_TABLET | Freq: Two times a day (BID) | ORAL | Status: DC

## 2015-06-21 NOTE — Telephone Encounter (Signed)
-----   Message from Hildred Laser, MD sent at 06/20/2015 11:27 AM EDT ----- Please inform patient of BV infection.  Needs treatment with Flagyl 500 mg BID x 7 days.

## 2015-06-21 NOTE — Telephone Encounter (Signed)
Pt aware. Meds erx.  

## 2015-06-21 NOTE — Telephone Encounter (Signed)
I'm not sure I understand.  What is the purpose for the ultrasound? Is it for her anatomy scan?

## 2015-06-21 NOTE — Telephone Encounter (Signed)
PT IS COMING ON 10/4 AND WANTS TO KNOW IF YOU CAN ORDER HER AN US PELVIC FOR HER WORK PURPOSES. THEY LIKE TO KNOW 2 MOS AHEAD OF TIME.

## 2015-06-25 ENCOUNTER — Encounter: Payer: Self-pay | Admitting: Obstetrics and Gynecology

## 2015-06-25 NOTE — Telephone Encounter (Signed)
I called Penny Zimmerman and yes it is her Anatomy Scan I think in 4-5 wks.

## 2015-06-25 NOTE — Telephone Encounter (Signed)
I called pt as f/u due to no phone call received about her legs feeling like jelly and did advice given help and pt said yes.

## 2015-06-26 ENCOUNTER — Ambulatory Visit (INDEPENDENT_AMBULATORY_CARE_PROVIDER_SITE_OTHER): Payer: Medicaid Other | Admitting: Obstetrics and Gynecology

## 2015-06-26 VITALS — BP 119/68 | HR 80 | Wt 189.5 lb

## 2015-06-26 DIAGNOSIS — N760 Acute vaginitis: Secondary | ICD-10-CM

## 2015-06-26 DIAGNOSIS — O99342 Other mental disorders complicating pregnancy, second trimester: Secondary | ICD-10-CM

## 2015-06-26 DIAGNOSIS — A499 Bacterial infection, unspecified: Secondary | ICD-10-CM

## 2015-06-26 DIAGNOSIS — F329 Major depressive disorder, single episode, unspecified: Secondary | ICD-10-CM

## 2015-06-26 DIAGNOSIS — Z3492 Encounter for supervision of normal pregnancy, unspecified, second trimester: Secondary | ICD-10-CM

## 2015-06-26 DIAGNOSIS — N871 Moderate cervical dysplasia: Secondary | ICD-10-CM

## 2015-06-26 DIAGNOSIS — F32A Depression, unspecified: Secondary | ICD-10-CM

## 2015-06-26 DIAGNOSIS — B9689 Other specified bacterial agents as the cause of diseases classified elsewhere: Secondary | ICD-10-CM

## 2015-06-26 LAB — POCT URINALYSIS DIP (MANUAL ENTRY)
BILIRUBIN UA: NEGATIVE
Blood, UA: NEGATIVE
GLUCOSE UA: NEGATIVE
Ketones, POC UA: NEGATIVE
Leukocytes, UA: NEGATIVE
Nitrite, UA: NEGATIVE
Protein Ur, POC: NEGATIVE
Urobilinogen, UA: 0.2
pH, UA: 6

## 2015-06-26 MED ORDER — ENLYTE PO CAPS: 1.0000 | ORAL_CAPSULE | Freq: Every day | ORAL | Status: DC

## 2015-06-26 NOTE — Patient Instructions (Signed)

## 2015-06-26 NOTE — Progress Notes (Signed)
Patient denies FLU Shot today

## 2015-06-26 NOTE — Progress Notes (Signed)
ROB: Patient complains of feeling depressed over the past week.  Denies any recent stressors.  Notes changes in mood, difficulty sleeping (alternating insomnia and hypersomnia, and difficulty concentrating). Offered psychotherapy, and trial of Enlyte.  Patient declines antidepressant therapy at this time.  Also discussed ECC results from colposcopy (moderate dysplasia, CIN II-III).  Will monitor and repeat pap and colposcopy postpartum.  Is taking Flagyl for BV infection. Declines flu vaccine. RTC in 4 weeks.  For anatomy scan and serum AFP at that time.

## 2015-06-26 NOTE — Telephone Encounter (Signed)
Ok.  Well she was seen today. The order should be in and I think we got her scheduled today, but just double check behind me to be sure.  Hildred Laser, MD

## 2015-06-27 ENCOUNTER — Telehealth: Payer: Self-pay | Admitting: Obstetrics and Gynecology

## 2015-06-27 NOTE — Telephone Encounter (Signed)
Please let patient know that anything else that could be called in would be an anti-depressant medication, based on what Medicaid covers.  If she is willing to be started on an anti-depressant I will call her something in.

## 2015-06-27 NOTE — Telephone Encounter (Signed)
Penny Zimmerman went to get her antidepressants and Medicaid doesn't cover that one. It wouldvcost $180.00. Can you send something else that medicaid covers.

## 2015-06-29 ENCOUNTER — Telehealth: Payer: Self-pay | Admitting: Obstetrics and Gynecology

## 2015-06-29 NOTE — Telephone Encounter (Signed)
PT CALLED AND SPOKE WITH ROBIN ABOUT THE ENLITE, MEDICAID DOES NOT COVER AND SHE TALKED TO YOU AND YOU SAID YOU CAN CALL IN A DIFFERENT RX, AND SHE WOULD LIKE THE MOOD BOOSTER BUT PT WANTED A ANTIDEPRESSANT, AND THAT WHAT SHE WOULD LIKE.

## 2015-07-03 MED ORDER — SERTRALINE HCL 100 MG PO TABS: 100.0000 mg | ORAL_TABLET | Freq: Every day | ORAL | Status: DC

## 2015-07-03 NOTE — Telephone Encounter (Signed)
Please inform patient that I will start her on Zoloft for her depression.

## 2015-07-03 NOTE — Telephone Encounter (Signed)
PT AWARE  

## 2015-07-25 ENCOUNTER — Encounter: Payer: Medicaid Other | Admitting: Obstetrics and Gynecology

## 2015-07-25 ENCOUNTER — Ambulatory Visit: Payer: Medicaid Other

## 2015-07-25 DIAGNOSIS — Z3492 Encounter for supervision of normal pregnancy, unspecified, second trimester: Secondary | ICD-10-CM

## 2015-08-29 ENCOUNTER — Ambulatory Visit (INDEPENDENT_AMBULATORY_CARE_PROVIDER_SITE_OTHER): Payer: Medicaid Other | Admitting: Obstetrics and Gynecology

## 2015-08-29 VITALS — BP 126/75 | HR 96 | Wt 198.2 lb

## 2015-08-29 DIAGNOSIS — Z131 Encounter for screening for diabetes mellitus: Secondary | ICD-10-CM

## 2015-08-29 DIAGNOSIS — O360121 Maternal care for anti-D [Rh] antibodies, second trimester, fetus 1: Secondary | ICD-10-CM

## 2015-08-29 DIAGNOSIS — Z3482 Encounter for supervision of other normal pregnancy, second trimester: Secondary | ICD-10-CM

## 2015-08-29 DIAGNOSIS — Z3492 Encounter for supervision of normal pregnancy, unspecified, second trimester: Secondary | ICD-10-CM

## 2015-08-29 LAB — POCT URINALYSIS DIPSTICK
BILIRUBIN UA: NEGATIVE
GLUCOSE UA: NEGATIVE
Ketones, UA: NEGATIVE
LEUKOCYTES UA: NEGATIVE
NITRITE UA: NEGATIVE
Protein, UA: NEGATIVE
Spec Grav, UA: <=1.005
UROBILINOGEN UA: NEGATIVE
pH, UA: 8.5

## 2015-08-29 NOTE — Progress Notes (Signed)
ROB: Reports complaints of pelvic pressure.  Denies contractions. Notes that she discontinued her Zoloft due to feeling better.  Normal anatomy scan last visit. Did not get serum AFP completed.  RTC in 4 weeks.  For 28 week labs, Tdap, Rhogam, and blood consents at that time.

## 2015-09-23 NOTE — L&D Delivery Note (Signed)
Delivery Summary for Penny Zimmerman  Labor Events:   Preterm labor:   Rupture date:   Rupture time:   Rupture type: Spontaneous  Fluid Color:   Induction:   Augmentation:   Complications:   Cervical ripening:          Delivery:   Episiotomy:   Lacerations:   Repair suture:   Repair # of packets:   Blood loss (ml): 100    Information for the patient's newbornPecola Lawless:  Stephens, Boy Monta [[id number]]    Delivery 12/13/2015 11:30 AM by  Vaginal, Spontaneous Delivery Sex:  female Gestational Age: 6557w3d Delivery Clinician:  Hildred LaserAnika Shruthi Northrup Living?: Yes        APGARS  One minute Five minutes Ten minutes  Skin color: 1   2      Heart rate: 2   2      Grimace: 2   2      Muscle tone: 2   2      Breathing: 2   2      Totals: 9  10      Presentation/position: Vertex     Resuscitation: None  Cord information: 3 vessels   Disposition of cord blood: No    Blood gases sent? No Complications: None  Placenta: Delivered: 12/13/2015 11:36 AM  Spontaneous  Intact appearance Newborn Measurements: Weight: 6 lb 7 oz (2920 g)  Height: 19.09"  Head circumference: 32 cm  Chest circumference: 32 cm  Other providers:   Vale HavenJamie A Coble Tiffany D [place]  Additional  information: Forceps:   Vacuum:   Breech:   Observed anomalies        Delivery Note At 11:30 AM a viable and healthy female was delivered via Vaginal, Spontaneous Delivery (Presentation:  Vertex; Left Occiput Anterior).  APGAR: 9, 10; weight 6 lb 7 oz (2920 g).   Placenta status: Intact, Spontaneous.  Cord: 3 vessels with the following complications: None.  Cord pH: not obtained.   Anesthesia: Epidural  Episiotomy: None Lacerations:  None Suture Repair: None Est. Blood Loss (mL):  100 ml  Mom to postpartum.  Baby to Couplet care / Skin to Skin.  Hildred Lasernika Niguel Moure 12/13/2015, 1:42 PM

## 2015-09-25 ENCOUNTER — Encounter: Payer: Medicaid Other | Admitting: Obstetrics and Gynecology

## 2015-09-26 ENCOUNTER — Ambulatory Visit (INDEPENDENT_AMBULATORY_CARE_PROVIDER_SITE_OTHER): Payer: Medicaid Other | Admitting: Obstetrics and Gynecology

## 2015-09-26 VITALS — BP 131/72 | HR 89 | Wt 206.6 lb

## 2015-09-26 DIAGNOSIS — O360121 Maternal care for anti-D [Rh] antibodies, second trimester, fetus 1: Secondary | ICD-10-CM | POA: Diagnosis not present

## 2015-09-26 DIAGNOSIS — Z3492 Encounter for supervision of normal pregnancy, unspecified, second trimester: Secondary | ICD-10-CM

## 2015-09-26 DIAGNOSIS — Z3482 Encounter for supervision of other normal pregnancy, second trimester: Secondary | ICD-10-CM

## 2015-09-26 LAB — POCT URINALYSIS DIPSTICK
Bilirubin, UA: NEGATIVE
Blood, UA: NEGATIVE
Glucose, UA: NEGATIVE
Ketones, UA: NEGATIVE
LEUKOCYTES UA: NEGATIVE
Nitrite, UA: NEGATIVE
PROTEIN UA: NEGATIVE
Spec Grav, UA: <=1.005
UROBILINOGEN UA: NEGATIVE
pH, UA: 7

## 2015-09-26 MED ORDER — RHO D IMMUNE GLOBULIN 1500 UNITS IM SOSY
1500.0000 [IU] | PREFILLED_SYRINGE | Freq: Once | INTRAMUSCULAR | Status: AC
  Administered 2015-09-26: 1500 [IU] via INTRAMUSCULAR

## 2015-09-26 NOTE — Progress Notes (Signed)
ROB: Patient denies complaints.  For glucola and Rhogam today.  Declines Tdap.  Blood consent signed. RTC in 2 weeks. Patient voided just prior to clinic visit. RTC in 2 weeks

## 2015-09-27 LAB — HEMOGLOBIN AND HEMATOCRIT, BLOOD
HEMOGLOBIN: 10.5 g/dL — AB (ref 11.1–15.9)
Hematocrit: 32.1 % — ABNORMAL LOW (ref 34.0–46.6)

## 2015-09-27 LAB — GLUCOSE TOLERANCE, 1 HOUR: Glucose, 1Hr PP: 93 mg/dL (ref 65–199)

## 2015-09-28 ENCOUNTER — Telehealth: Payer: Self-pay

## 2015-09-28 NOTE — Telephone Encounter (Signed)
-----   Message from Hildred LaserAnika Cherry, MD sent at 09/28/2015  1:38 PM EST ----- Please inform patient of normal glucola result.

## 2015-09-28 NOTE — Telephone Encounter (Signed)
Pt informed of normal glucola

## 2015-10-10 ENCOUNTER — Ambulatory Visit (INDEPENDENT_AMBULATORY_CARE_PROVIDER_SITE_OTHER): Payer: Medicaid Other | Admitting: Obstetrics and Gynecology

## 2015-10-10 ENCOUNTER — Encounter: Payer: Self-pay | Admitting: Obstetrics and Gynecology

## 2015-10-10 VITALS — BP 105/66 | HR 84 | Wt 207.0 lb

## 2015-10-10 DIAGNOSIS — Z3493 Encounter for supervision of normal pregnancy, unspecified, third trimester: Secondary | ICD-10-CM

## 2015-10-10 NOTE — Progress Notes (Signed)
ROB- GTT-wnl. Unable to void.

## 2015-10-17 DIAGNOSIS — Z3483 Encounter for supervision of other normal pregnancy, third trimester: Secondary | ICD-10-CM

## 2015-10-24 ENCOUNTER — Ambulatory Visit (INDEPENDENT_AMBULATORY_CARE_PROVIDER_SITE_OTHER): Payer: Medicaid Other | Admitting: Obstetrics and Gynecology

## 2015-10-24 VITALS — BP 103/64 | HR 96 | Wt 213.0 lb

## 2015-10-24 DIAGNOSIS — Z3493 Encounter for supervision of normal pregnancy, unspecified, third trimester: Secondary | ICD-10-CM

## 2015-10-24 DIAGNOSIS — F32A Depression, unspecified: Secondary | ICD-10-CM

## 2015-10-24 DIAGNOSIS — O360121 Maternal care for anti-D [Rh] antibodies, second trimester, fetus 1: Secondary | ICD-10-CM

## 2015-10-24 DIAGNOSIS — F329 Major depressive disorder, single episode, unspecified: Secondary | ICD-10-CM

## 2015-10-24 DIAGNOSIS — Z3483 Encounter for supervision of other normal pregnancy, third trimester: Secondary | ICD-10-CM

## 2015-10-24 HISTORY — DX: Depression, unspecified: F32.A

## 2015-10-24 LAB — POCT URINALYSIS DIPSTICK
BILIRUBIN UA: NEGATIVE
GLUCOSE UA: NEGATIVE
KETONES UA: NEGATIVE
Nitrite, UA: NEGATIVE
PROTEIN UA: NEGATIVE
Urobilinogen, UA: NEGATIVE
pH, UA: 8

## 2015-10-24 NOTE — Progress Notes (Signed)
ROB: Patient complains of pelvic pressure and mild pelvic discomfort. Advised on Tylenol, belly band, warm baths.  RTC in 2 weeks.

## 2015-11-05 ENCOUNTER — Telehealth: Payer: Self-pay | Admitting: Obstetrics and Gynecology

## 2015-11-05 NOTE — Telephone Encounter (Signed)
Called pt she states she has had a persistent headache for 2 days. Pt states this is unusual for her as she does not frequently have headaches. Pt has tried Tylenol, pushing fluids and resting with feet elevated with no relief. Pt also notes dizziness. Pt denies blurred vision and swelling. Advised pt to come in tomorrow rather than her appt scheduled for wednesday. Pt to come tomorrow at 9:15am.

## 2015-11-05 NOTE — Telephone Encounter (Signed)
Pt has had a headache for 2 days. She is [redacted] wks pregnant and is concerned because she never has headaches, pt has an appt wed morn. Can u call her.

## 2015-11-06 ENCOUNTER — Ambulatory Visit (INDEPENDENT_AMBULATORY_CARE_PROVIDER_SITE_OTHER): Payer: Medicaid Other | Admitting: Obstetrics and Gynecology

## 2015-11-06 VITALS — BP 103/68 | HR 88 | Wt 215.1 lb

## 2015-11-06 DIAGNOSIS — Z3493 Encounter for supervision of normal pregnancy, unspecified, third trimester: Secondary | ICD-10-CM

## 2015-11-06 DIAGNOSIS — O26893 Other specified pregnancy related conditions, third trimester: Secondary | ICD-10-CM

## 2015-11-06 DIAGNOSIS — R51 Headache: Secondary | ICD-10-CM

## 2015-11-06 DIAGNOSIS — O360131 Maternal care for anti-D [Rh] antibodies, third trimester, fetus 1: Secondary | ICD-10-CM

## 2015-11-06 DIAGNOSIS — Z3483 Encounter for supervision of other normal pregnancy, third trimester: Secondary | ICD-10-CM

## 2015-11-06 LAB — POCT URINALYSIS DIPSTICK
BILIRUBIN UA: NEGATIVE
Glucose, UA: NEGATIVE
Ketones, UA: NEGATIVE
NITRITE UA: NEGATIVE
PH UA: 6.5
PROTEIN UA: NEGATIVE
Spec Grav, UA: 1.01
Urobilinogen, UA: NEGATIVE

## 2015-11-06 NOTE — Patient Instructions (Signed)
Postpartum Tubal Ligation Postpartum tubal ligation (PPTL) is a procedure that closes the fallopian tubes right after childbirth or 1-2 days after childbirth. PPTL is done before the uterus returns to its normal location. The procedure is also called a mini-laparotomy. When the fallopian tubes are closed, the eggs that are released from the ovaries cannot enter the uterus, and sperm cannot reach the egg. PPTL is done so you will not be able to get pregnant or have a baby. Although this procedure may be undone (reversed), it should be considered permanent and irreversible. If you want to have future pregnancies, you should not have this procedure. LET YOUR HEALTH CARE PROVIDER KNOW ABOUT:  Any allergies you have.  All medicines you are taking, including vitamins, herbs, eye drops, creams, and over-the-counter medicines. This includes any use of steroids, either by mouth or in cream form.  Previous problems you or members of your family have had with the use of anesthetics.  Any blood disorders you have.  Previous surgeries you have had.  Any medical conditions you may have. RISKS AND COMPLICATIONS  Infection.  Bleeding.  Injury to surrounding organs.  Side effects from anesthetics.  Failure of the procedure.  Ectopic pregnancy.  Future regret about having the procedure done. BEFORE THE PROCEDURE  You may need to sign certain documents, including an informed consent form, up to 30 days before the date of your tubal ligation.  Follow instructions from your health care provider about eating and drinking restrictions. PROCEDURE  If done 1-2 days after a vaginal delivery:  You will be given one or more of the following:  A medicine that helps you relax (sedative).  A medicine that numbs the area (local anesthetic).  A medicine that makes you fall asleep (general anesthetic).  A medicine that is injected into an area of your body that numbs everything below the injection site  (regional anesthetic).  If you have been given general anesthetic, a tube will be put down your throat to help you breathe.  Your bladder may be emptied with a small tube (catheter).  A small cut (incision) will be made just above the pubic hair line.  The fallopian tubes will be located and brought up through the incision.  The fallopian tubes will be tied off or burned (cauterized), or they will be closed with a clamp, ring, or clip. In many cases, a small portion in the center of each fallopian tube will also be removed.  The incision will be closed with stitches (sutures).  A bandage (dressing) will be placed over the incision. The procedure may vary among health care providers and hospitals. If done after a cesarean delivery:  Tubal ligation will be done through the incision that was used for the cesarean delivery of your baby.  After the tubes are closed, the incision will be closed with stitches (sutures).  A bandage (dressing) will be placed over the incision. The procedure may vary among health care providers and hospitals. AFTER THE PROCEDURE  Your blood pressure, heart rate, breathing rate, and blood oxygen level will be monitored often until the medicines you were given have worn off.  You will be given pain medicine as needed.  If you had general anesthetic, you may have some mild discomfort in your throat. This is from the breathing tube that was placed in your throat while you were sleeping.  You may feel tired, and you should rest for the remainder of the day.  You may have some pain   or cramps in the abdominal area for 3-7 days.   This information is not intended to replace advice given to you by your health care provider. Make sure you discuss any questions you have with your health care provider.   Document Released: 09/08/2005 Document Revised: 01/23/2015 Document Reviewed: 12/20/2011 Elsevier Interactive Patient Education 2016 Elsevier Inc.  

## 2015-11-06 NOTE — Progress Notes (Signed)
ROB: Patient notes that she had headache x 2 days, has resolved.  Headache was noted to be mostly behind left eye, sounds like sinus pressure.  Advised on Sudafed/Benadryl prn.  Discussed cord blood banking.  Desires circumcision for female infant, desires to breastfeed, wants BTL for contraception.  Patient counseled on all other forms of contraception, declines all other forms.  BTL papers signed today.

## 2015-11-07 ENCOUNTER — Encounter: Payer: Medicaid Other | Admitting: Obstetrics and Gynecology

## 2015-11-20 ENCOUNTER — Ambulatory Visit (INDEPENDENT_AMBULATORY_CARE_PROVIDER_SITE_OTHER): Payer: Medicaid Other | Admitting: Obstetrics and Gynecology

## 2015-11-20 VITALS — BP 106/66 | HR 90 | Wt 218.1 lb

## 2015-11-20 DIAGNOSIS — Z113 Encounter for screening for infections with a predominantly sexual mode of transmission: Secondary | ICD-10-CM

## 2015-11-20 DIAGNOSIS — Z36 Encounter for antenatal screening of mother: Secondary | ICD-10-CM

## 2015-11-20 DIAGNOSIS — Z369 Encounter for antenatal screening, unspecified: Secondary | ICD-10-CM

## 2015-11-20 DIAGNOSIS — Z3483 Encounter for supervision of other normal pregnancy, third trimester: Secondary | ICD-10-CM

## 2015-11-20 DIAGNOSIS — Z3493 Encounter for supervision of normal pregnancy, unspecified, third trimester: Secondary | ICD-10-CM

## 2015-11-20 LAB — POCT URINALYSIS DIPSTICK
BILIRUBIN UA: NEGATIVE
Glucose, UA: NEGATIVE
KETONES UA: NEGATIVE
Nitrite, UA: NEGATIVE
PROTEIN UA: NEGATIVE
SPEC GRAV UA: 1.01
Urobilinogen, UA: NEGATIVE
pH, UA: 7

## 2015-11-20 NOTE — Progress Notes (Signed)
ROB: Patient denies complaints.  36 week labs done today.  RTC in 1 week.  PTL precautions/fetal kick counts reviewed.

## 2015-11-22 LAB — GC/CHLAMYDIA PROBE AMP
CHLAMYDIA, DNA PROBE: NEGATIVE
NEISSERIA GONORRHOEAE BY PCR: NEGATIVE

## 2015-11-24 LAB — CULTURE, BETA STREP (GROUP B ONLY): Strep Gp B Culture: NEGATIVE

## 2015-11-27 ENCOUNTER — Encounter: Payer: Self-pay | Admitting: Obstetrics and Gynecology

## 2015-11-27 ENCOUNTER — Ambulatory Visit (INDEPENDENT_AMBULATORY_CARE_PROVIDER_SITE_OTHER): Payer: Medicaid Other | Admitting: Obstetrics and Gynecology

## 2015-11-27 VITALS — BP 136/69 | HR 92 | Wt 220.4 lb

## 2015-11-27 DIAGNOSIS — Z3493 Encounter for supervision of normal pregnancy, unspecified, third trimester: Secondary | ICD-10-CM

## 2015-11-27 DIAGNOSIS — Z3483 Encounter for supervision of other normal pregnancy, third trimester: Secondary | ICD-10-CM

## 2015-11-27 LAB — POCT URINALYSIS DIPSTICK
BILIRUBIN UA: NEGATIVE
GLUCOSE UA: NEGATIVE
KETONES UA: NEGATIVE
Nitrite, UA: NEGATIVE
PH UA: 6
Protein, UA: NEGATIVE
RBC UA: NEGATIVE
Spec Grav, UA: >=1.03
Urobilinogen, UA: NEGATIVE

## 2015-12-04 ENCOUNTER — Ambulatory Visit (INDEPENDENT_AMBULATORY_CARE_PROVIDER_SITE_OTHER): Payer: Medicaid Other | Admitting: Obstetrics and Gynecology

## 2015-12-04 VITALS — BP 114/71 | HR 80 | Wt 217.8 lb

## 2015-12-04 DIAGNOSIS — O219 Vomiting of pregnancy, unspecified: Secondary | ICD-10-CM

## 2015-12-04 DIAGNOSIS — F1220 Cannabis dependence, uncomplicated: Secondary | ICD-10-CM | POA: Insufficient documentation

## 2015-12-04 DIAGNOSIS — Z3493 Encounter for supervision of normal pregnancy, unspecified, third trimester: Secondary | ICD-10-CM

## 2015-12-04 DIAGNOSIS — F1290 Cannabis use, unspecified, uncomplicated: Secondary | ICD-10-CM

## 2015-12-04 DIAGNOSIS — Z3483 Encounter for supervision of other normal pregnancy, third trimester: Secondary | ICD-10-CM

## 2015-12-04 LAB — POCT URINALYSIS DIPSTICK
Bilirubin, UA: NEGATIVE
Glucose, UA: NEGATIVE
Ketones, UA: NEGATIVE
NITRITE UA: NEGATIVE
PH UA: 7.5
Protein, UA: NEGATIVE
RBC UA: NEGATIVE
Spec Grav, UA: <=1.005
Urobilinogen, UA: NEGATIVE

## 2015-12-04 NOTE — Progress Notes (Addendum)
ROB: Denies complaints.  Notes braxton hicks. Labor precautions given.  36 week labs negative. Concerned regarding baby testing positive for marijuana as she used last week for nausea/vomiting symptoms.  Discussed alternative treatments for nausea/vomiting, advised on refraining from marijuana use during pregnancy. RTC in 1 week.

## 2015-12-11 ENCOUNTER — Encounter: Payer: Self-pay | Admitting: Obstetrics and Gynecology

## 2015-12-11 ENCOUNTER — Ambulatory Visit (INDEPENDENT_AMBULATORY_CARE_PROVIDER_SITE_OTHER): Payer: Medicaid Other | Admitting: Obstetrics and Gynecology

## 2015-12-11 VITALS — BP 109/67 | HR 90 | Wt 220.0 lb

## 2015-12-11 DIAGNOSIS — Z3483 Encounter for supervision of other normal pregnancy, third trimester: Secondary | ICD-10-CM

## 2015-12-11 DIAGNOSIS — F1220 Cannabis dependence, uncomplicated: Secondary | ICD-10-CM

## 2015-12-11 DIAGNOSIS — F1290 Cannabis use, unspecified, uncomplicated: Secondary | ICD-10-CM

## 2015-12-11 DIAGNOSIS — Z3493 Encounter for supervision of normal pregnancy, unspecified, third trimester: Secondary | ICD-10-CM

## 2015-12-11 LAB — POCT URINALYSIS DIPSTICK
Bilirubin, UA: NEGATIVE
Blood, UA: NEGATIVE
GLUCOSE UA: NEGATIVE
Ketones, UA: NEGATIVE
NITRITE UA: NEGATIVE
Protein, UA: NEGATIVE
Spec Grav, UA: 1.02
UROBILINOGEN UA: NEGATIVE
pH, UA: 6.5

## 2015-12-11 NOTE — Progress Notes (Signed)
ROB: Patient doing well, no complaints.   Attempted membrane stripping today, however unsuccessful due to patient intolerance. Labor precautions and fetal kick count instructions given.  RTC in 1 week.

## 2015-12-12 ENCOUNTER — Inpatient Hospital Stay
Admission: EM | Admit: 2015-12-12 | Discharge: 2015-12-15 | DRG: 767 | Disposition: A | Discharge status: Home / Self Care | Payer: Medicaid Other | Attending: Obstetrics and Gynecology | Admitting: Obstetrics and Gynecology

## 2015-12-12 DIAGNOSIS — O99334 Smoking (tobacco) complicating childbirth: Secondary | ICD-10-CM | POA: Diagnosis present

## 2015-12-12 DIAGNOSIS — O26899 Other specified pregnancy related conditions, unspecified trimester: Secondary | ICD-10-CM | POA: Diagnosis present

## 2015-12-12 DIAGNOSIS — Z6791 Unspecified blood type, Rh negative: Secondary | ICD-10-CM

## 2015-12-12 DIAGNOSIS — O4292 Full-term premature rupture of membranes, unspecified as to length of time between rupture and onset of labor: Principal | ICD-10-CM | POA: Diagnosis present

## 2015-12-12 DIAGNOSIS — Z302 Encounter for sterilization: Secondary | ICD-10-CM

## 2015-12-12 DIAGNOSIS — F17210 Nicotine dependence, cigarettes, uncomplicated: Secondary | ICD-10-CM | POA: Diagnosis present

## 2015-12-12 DIAGNOSIS — Z3A39 39 weeks gestation of pregnancy: Secondary | ICD-10-CM

## 2015-12-12 DIAGNOSIS — O4290 Premature rupture of membranes, unspecified as to length of time between rupture and onset of labor, unspecified weeks of gestation: Secondary | ICD-10-CM | POA: Diagnosis present

## 2015-12-12 HISTORY — DX: Major depressive disorder, single episode, unspecified: F32.9

## 2015-12-12 HISTORY — DX: Reserved for concepts with insufficient information to code with codable children: IMO0002

## 2015-12-13 ENCOUNTER — Inpatient Hospital Stay: Payer: Medicaid Other | Admitting: Anesthesiology

## 2015-12-13 DIAGNOSIS — Z302 Encounter for sterilization: Secondary | ICD-10-CM | POA: Diagnosis not present

## 2015-12-13 DIAGNOSIS — F17210 Nicotine dependence, cigarettes, uncomplicated: Secondary | ICD-10-CM | POA: Diagnosis present

## 2015-12-13 DIAGNOSIS — Z3493 Encounter for supervision of normal pregnancy, unspecified, third trimester: Secondary | ICD-10-CM

## 2015-12-13 DIAGNOSIS — O99334 Smoking (tobacco) complicating childbirth: Secondary | ICD-10-CM | POA: Diagnosis present

## 2015-12-13 DIAGNOSIS — O4290 Premature rupture of membranes, unspecified as to length of time between rupture and onset of labor, unspecified weeks of gestation: Secondary | ICD-10-CM | POA: Diagnosis present

## 2015-12-13 DIAGNOSIS — Z3A39 39 weeks gestation of pregnancy: Secondary | ICD-10-CM | POA: Diagnosis not present

## 2015-12-13 DIAGNOSIS — O4292 Full-term premature rupture of membranes, unspecified as to length of time between rupture and onset of labor: Secondary | ICD-10-CM | POA: Diagnosis present

## 2015-12-13 LAB — CBC
HCT: 30.9 % — ABNORMAL LOW (ref 35.0–47.0)
Hemoglobin: 10.1 g/dL — ABNORMAL LOW (ref 12.0–16.0)
MCH: 26 pg (ref 26.0–34.0)
MCHC: 32.8 g/dL (ref 32.0–36.0)
MCV: 79.1 fL — ABNORMAL LOW (ref 80.0–100.0)
PLATELETS: 224 10*3/uL (ref 150–440)
RBC: 3.9 MIL/uL (ref 3.80–5.20)
RDW: 15.2 % — AB (ref 11.5–14.5)
WBC: 16.8 10*3/uL — AB (ref 3.6–11.0)

## 2015-12-13 LAB — URINE DRUG SCREEN, QUALITATIVE (ARMC ONLY)
Amphetamines, Ur Screen: NOT DETECTED
BARBITURATES, UR SCREEN: NOT DETECTED
Benzodiazepine, Ur Scrn: NOT DETECTED
CANNABINOID 50 NG, UR ~~LOC~~: POSITIVE — AB
Cocaine Metabolite,Ur ~~LOC~~: NOT DETECTED
MDMA (ECSTASY) UR SCREEN: NOT DETECTED
Methadone Scn, Ur: NOT DETECTED
Opiate, Ur Screen: NOT DETECTED
PHENCYCLIDINE (PCP) UR S: NOT DETECTED
TRICYCLIC, UR SCREEN: NOT DETECTED

## 2015-12-13 LAB — RAPID HIV SCREEN (HIV 1/2 AB+AG)
HIV 1/2 ANTIBODIES: NONREACTIVE
HIV-1 P24 ANTIGEN - HIV24: NONREACTIVE

## 2015-12-13 LAB — TYPE AND SCREEN
ABO/RH(D): O NEG
Antibody Screen: NEGATIVE

## 2015-12-13 MED ORDER — LIDOCAINE HCL (PF) 1 % IJ SOLN
INTRAMUSCULAR | Status: DC | PRN
  Administered 2015-12-13: 3 mL via SUBCUTANEOUS

## 2015-12-13 MED ORDER — WITCH HAZEL-GLYCERIN EX PADS: 1.0000 "application " | MEDICATED_PAD | CUTANEOUS | Status: DC | PRN

## 2015-12-13 MED ORDER — BENZOCAINE-MENTHOL 20-0.5 % EX AERO: 1.0000 "application " | INHALATION_SPRAY | CUTANEOUS | Status: DC | PRN

## 2015-12-13 MED ORDER — DIPHENHYDRAMINE HCL 50 MG/ML IJ SOLN: 12.5000 mg | INTRAMUSCULAR | Status: DC | PRN

## 2015-12-13 MED ORDER — SODIUM CHLORIDE 0.9% FLUSH
3.0000 mL | Freq: Four times a day (QID) | INTRAVENOUS | Status: DC
  Administered 2015-12-14 (×2): 3 mL via INTRAVENOUS

## 2015-12-13 MED ORDER — BUTORPHANOL TARTRATE 1 MG/ML IJ SOLN
1.0000 mg | INTRAMUSCULAR | Status: DC | PRN
  Administered 2015-12-13 (×3): 1 mg via INTRAVENOUS
  Filled 2015-12-13 (×3): qty 1

## 2015-12-13 MED ORDER — ONDANSETRON HCL 4 MG PO TABS: 4.0000 mg | ORAL_TABLET | ORAL | Status: DC | PRN

## 2015-12-13 MED ORDER — OXYTOCIN 10 UNIT/ML IJ SOLN
INTRAMUSCULAR | Status: AC
  Filled 2015-12-13: qty 2

## 2015-12-13 MED ORDER — METOCLOPRAMIDE HCL 10 MG PO TABS
10.0000 mg | ORAL_TABLET | Freq: Once | ORAL | Status: AC
  Administered 2015-12-13: 10 mg via ORAL
  Filled 2015-12-13: qty 1

## 2015-12-13 MED ORDER — SENNOSIDES-DOCUSATE SODIUM 8.6-50 MG PO TABS
2.0000 | ORAL_TABLET | ORAL | Status: DC
  Administered 2015-12-13 – 2015-12-14 (×2): 2 via ORAL
  Filled 2015-12-13 (×2): qty 2

## 2015-12-13 MED ORDER — LACTATED RINGERS IV SOLN: INTRAVENOUS | Status: DC

## 2015-12-13 MED ORDER — PRENATAL MULTIVITAMIN CH
1.0000 | ORAL_TABLET | Freq: Every day | ORAL | Status: DC
  Administered 2015-12-14 – 2015-12-15 (×2): 1 via ORAL
  Filled 2015-12-13 (×2): qty 1

## 2015-12-13 MED ORDER — FAMOTIDINE 20 MG PO TABS
40.0000 mg | ORAL_TABLET | Freq: Once | ORAL | Status: AC
  Administered 2015-12-13: 40 mg via ORAL
  Filled 2015-12-13: qty 2

## 2015-12-13 MED ORDER — LACTATED RINGERS IV SOLN: 500.0000 mL | Freq: Once | INTRAVENOUS | Status: DC

## 2015-12-13 MED ORDER — ACETAMINOPHEN 325 MG PO TABS: 650.0000 mg | ORAL_TABLET | ORAL | Status: DC | PRN

## 2015-12-13 MED ORDER — AMMONIA AROMATIC IN INHA
RESPIRATORY_TRACT | Status: AC
  Filled 2015-12-13: qty 10

## 2015-12-13 MED ORDER — LIDOCAINE HCL (PF) 1 % IJ SOLN: 30.0000 mL | INTRAMUSCULAR | Status: DC | PRN

## 2015-12-13 MED ORDER — IBUPROFEN 800 MG PO TABS
ORAL_TABLET | ORAL | Status: AC
  Filled 2015-12-13: qty 1

## 2015-12-13 MED ORDER — OXYTOCIN BOLUS FROM INFUSION
500.0000 mL | INTRAVENOUS | Status: DC
  Administered 2015-12-13: 500 mL via INTRAVENOUS

## 2015-12-13 MED ORDER — OXYTOCIN 40 UNITS IN LACTATED RINGERS INFUSION - SIMPLE MED
1.0000 m[IU]/min | INTRAVENOUS | Status: DC
  Administered 2015-12-13: 1 m[IU]/min via INTRAVENOUS

## 2015-12-13 MED ORDER — OXYTOCIN 40 UNITS IN LACTATED RINGERS INFUSION - SIMPLE MED
2.5000 [IU]/h | INTRAVENOUS | Status: DC
  Filled 2015-12-13: qty 1000

## 2015-12-13 MED ORDER — BUPIVACAINE HCL (PF) 0.25 % IJ SOLN
INTRAMUSCULAR | Status: DC | PRN
  Administered 2015-12-13: 4 mL via EPIDURAL

## 2015-12-13 MED ORDER — IBUPROFEN 800 MG PO TABS
800.0000 mg | ORAL_TABLET | Freq: Four times a day (QID) | ORAL | Status: DC
Start: 2015-12-13 — End: 2015-12-15
  Administered 2015-12-13 – 2015-12-15 (×5): 800 mg via ORAL
  Filled 2015-12-13 (×5): qty 1

## 2015-12-13 MED ORDER — FENTANYL 2.5 MCG/ML W/ROPIVACAINE 0.2% IN NS 100 ML EPIDURAL INFUSION (ARMC-ANES)
EPIDURAL | Status: DC | PRN
  Administered 2015-12-13: 9 mL/h via EPIDURAL

## 2015-12-13 MED ORDER — FENTANYL 2.5 MCG/ML W/ROPIVACAINE 0.2% IN NS 100 ML EPIDURAL INFUSION (ARMC-ANES)
EPIDURAL | Status: AC
  Filled 2015-12-13: qty 100

## 2015-12-13 MED ORDER — PHENYLEPHRINE 40 MCG/ML (10ML) SYRINGE FOR IV PUSH (FOR BLOOD PRESSURE SUPPORT)
80.0000 ug | PREFILLED_SYRINGE | INTRAVENOUS | Status: DC | PRN
  Filled 2015-12-13: qty 2

## 2015-12-13 MED ORDER — LANOLIN HYDROUS EX OINT: TOPICAL_OINTMENT | CUTANEOUS | Status: DC | PRN

## 2015-12-13 MED ORDER — LACTATED RINGERS IV SOLN
INTRAVENOUS | Status: DC
Start: 2015-12-13 — End: 2015-12-13
  Administered 2015-12-13: 08:00:00 via INTRAVENOUS

## 2015-12-13 MED ORDER — FENTANYL 2.5 MCG/ML W/ROPIVACAINE 0.2% IN NS 100 ML EPIDURAL INFUSION (ARMC-ANES): 9.0000 mL/h | EPIDURAL | Status: DC

## 2015-12-13 MED ORDER — TERBUTALINE SULFATE 1 MG/ML IJ SOLN: 0.2500 mg | Freq: Once | INTRAMUSCULAR | Status: DC | PRN

## 2015-12-13 MED ORDER — ACETAMINOPHEN-CODEINE #3 300-30 MG PO TABS
1.0000 | ORAL_TABLET | ORAL | Status: DC | PRN
  Administered 2015-12-13 (×2): 2 via ORAL
  Filled 2015-12-13 (×2): qty 2

## 2015-12-13 MED ORDER — TETANUS-DIPHTH-ACELL PERTUSSIS 5-2.5-18.5 LF-MCG/0.5 IM SUSP: 0.5000 mL | Freq: Once | INTRAMUSCULAR | Status: DC

## 2015-12-13 MED ORDER — OXYCODONE-ACETAMINOPHEN 5-325 MG PO TABS: 2.0000 | ORAL_TABLET | ORAL | Status: DC | PRN

## 2015-12-13 MED ORDER — EPHEDRINE 5 MG/ML INJ
10.0000 mg | INTRAVENOUS | Status: DC | PRN
  Filled 2015-12-13: qty 2

## 2015-12-13 MED ORDER — OXYCODONE-ACETAMINOPHEN 5-325 MG PO TABS
ORAL_TABLET | ORAL | Status: AC
  Filled 2015-12-13: qty 2

## 2015-12-13 MED ORDER — OXYCODONE-ACETAMINOPHEN 5-325 MG PO TABS: 1.0000 | ORAL_TABLET | ORAL | Status: DC | PRN

## 2015-12-13 MED ORDER — DIBUCAINE 1 % RE OINT: 1.0000 "application " | TOPICAL_OINTMENT | RECTAL | Status: DC | PRN

## 2015-12-13 MED ORDER — LIDOCAINE-EPINEPHRINE (PF) 1.5 %-1:200000 IJ SOLN
INTRAMUSCULAR | Status: DC | PRN
  Administered 2015-12-13: 3 mL via EPIDURAL

## 2015-12-13 MED ORDER — LIDOCAINE HCL (PF) 1 % IJ SOLN
INTRAMUSCULAR | Status: AC
  Filled 2015-12-13: qty 30

## 2015-12-13 MED ORDER — IBUPROFEN 800 MG PO TABS
800.0000 mg | ORAL_TABLET | Freq: Four times a day (QID) | ORAL | Status: DC
  Administered 2015-12-13: 800 mg via ORAL

## 2015-12-13 MED ORDER — ZOLPIDEM TARTRATE 5 MG PO TABS
5.0000 mg | ORAL_TABLET | Freq: Every evening | ORAL | Status: DC | PRN
Start: 2015-12-13 — End: 2015-12-15

## 2015-12-13 MED ORDER — ONDANSETRON HCL 4 MG/2ML IJ SOLN: 4.0000 mg | INTRAMUSCULAR | Status: DC | PRN

## 2015-12-13 MED ORDER — LACTATED RINGERS IV SOLN
500.0000 mL | INTRAVENOUS | Status: DC | PRN
  Administered 2015-12-13: 500 mL via INTRAVENOUS

## 2015-12-13 MED ORDER — CITRIC ACID-SODIUM CITRATE 334-500 MG/5ML PO SOLN: 30.0000 mL | ORAL | Status: DC | PRN

## 2015-12-13 MED ORDER — DIPHENHYDRAMINE HCL 25 MG PO CAPS: 25.0000 mg | ORAL_CAPSULE | Freq: Four times a day (QID) | ORAL | Status: DC | PRN

## 2015-12-13 MED ORDER — ONDANSETRON HCL 4 MG/2ML IJ SOLN: 4.0000 mg | Freq: Four times a day (QID) | INTRAMUSCULAR | Status: DC | PRN

## 2015-12-13 MED ORDER — SIMETHICONE 80 MG PO CHEW: 80.0000 mg | CHEWABLE_TABLET | ORAL | Status: DC | PRN

## 2015-12-13 MED ORDER — MISOPROSTOL 200 MCG PO TABS
ORAL_TABLET | ORAL | Status: AC
  Filled 2015-12-13: qty 4

## 2015-12-13 NOTE — H&P (Signed)
Obstetric History and Physical  Penny Zimmerman is a 30 y.o. [id number] with IUP at 7358w3d presenting for ruptured membranes since 1000 pm last night. Patient states she has been having  irregular, every 15-20 minutes contractions, minimal vaginal bleeding, ruptured, clear fluid membranes, with active fetal movement.    Prenatal Course Source of Care: [facility] with onset of care at 11 weeks Pregnancy complications or risks: Patient Active Problem List   Diagnosis Date Noted  . PROM (premature rupture of membranes) 12/13/2015  . Marijuana smoker, episodic (HCC) 12/04/2015  . Depression 10/24/2015  . Abnormal cervical Papanicolaou smear affecting pregnancy in first trimester 06/12/2015  . 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 Supervision of normal pregnancy in third trimester 05/29/2015  . Rh negative status during pregnancy 05/12/2015   She plans to breastfeed She desires bilateral tubal ligation for postpartum contraception.   Prenatal labs and studies: ABO, Rh: --/--/O NEG (03/23 0026) Antibody: NEG (03/23 0026) Rubella: <20.0 (08/19 0950) RPR: Non Reactive (08/19 0950)  HBsAg: Negative (08/19 0950)  HIV: Non Reactive (08/19 0950)  GBS: Negative (202/28 1423) 1 hr Glucola - normal, 93 Genetic screening declined Anatomy US normal    Past Medical History  Diagnosis Date  . 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  For repeat postpartum.    . Depression 10/24/2015    Past Surgical History  Procedure Laterality Date  . No past surgeries      OB History  Gravida Para Term Preterm AB SAB TAB Ectopic Multiple Living  3 2 2       2     # Outcome Date GA Lbr Len/2nd Weight Sex Delivery Anes PTL Lv  3 Current           2 Term 04/26/12   8 lb 2 oz (3.685 kg) M Vag-Spont   Y  1 Term 04/24/10   7 lb 3 oz (3.26 kg) M Vag-Spont   Y      Social History   Social History  . Marital Status:  Divorced    Spouse Name: N/A  . Number of Children: N/A  . Years of Education: N/A   Occupational History  . Advertising account plannermanager     [employer]   Social History Main Topics  . Smoking status: Current Some Day Smoker  . Smokeless tobacco: Never Used  . Alcohol Use: No  . Drug Use: No  . Sexual Activity:    Partners: Male    Birth Control/ Protection: None     Comment: Pregnant   Other Topics Concern  . None   Social History Narrative    Family History  Problem Relation Age of Onset  . Diabetes Father   . Diabetes Paternal Grandmother   . Hypertension Father   . Hyperlipidemia Father     Prescriptions prior to admission  Medication Sig Dispense Refill Last Dose  . acetaminophen (TYLENOL) 325 MG tablet Take 650 mg by mouth every 6 (six) hours as needed.   Taking  . Prenatal Vit-Fe Fumarate-FA (PRENATAL MULTIVITAMIN) TABS tablet Take 1 tablet by mouth daily at 12 noon.   Taking    No Known Allergies  Review of Systems: Negative except for what is mentioned in HPI.  Physical Exam: BP 112/70 mmHg  Pulse 84  Temp(Src) 98.1 F (36.7 C) (Oral)  Resp 18  Ht 5\' 8"  (1.727 m)  Wt 220 lb (99.791 kg)  BMI 33.46 kg/m2  SpO2  99%  LMP 02/25/2015 (Approximate) CONSTITUTIONAL: Well-developed, well-nourished female in no acute distress.  HENT:  Normocephalic, atraumatic, External right and left ear normal. Oropharynx is clear and moist EYES: Conjunctivae and EOM are normal. Pupils are equal, round, and reactive to light. No scleral icterus.  NECK: Normal range of motion, supple, no masses SKIN: Skin is warm and dry. No rash noted. Not diaphoretic. No erythema. No pallor. NEUROLOGIC: Alert and oriented to person, place, and time. Normal reflexes, muscle tone coordination. No cranial nerve deficit noted. PSYCHIATRIC: Normal mood and affect. Normal behavior. Normal judgment and thought content. CARDIOVASCULAR: Normal heart rate noted, regular rhythm RESPIRATORY: Effort and breath sounds normal,  no problems with respiration noted ABDOMEN: Soft, nontender, nondistended, gravid. MUSCULOSKELETAL: Normal range of motion. No edema and no tenderness. 2+ distal pulses.  Cervical Exam: Dilatation 4cm   Effacement 60%   Station -2   Presentation: cephalic FHT:  Baseline rate 135 bpm   Variability moderate  Accelerations present   Decelerations variable (intermittent, shallow) Contractions: Every 2-5 mins   Pertinent Labs/Studies:   Results for orders placed or performed during the hospital encounter of 12/12/15 (from the past 24 hour(s))  Rapid HIV screen (HIV 1/2 Ab+Ag) ([facility] Only)     Status: None   Collection Time: 12/13/15 12:26 AM  Result Value Ref Range   HIV-1 P24 Antigen - HIV24 NON REACTIVE NON REACTIVE   HIV 1/2 Antibodies NON REACTIVE NON REACTIVE   Interpretation (HIV Ag Ab)      A non reactive test result means that HIV 1 or HIV 2 antibodies and HIV 1 p24 antigen were not detected in the specimen.  CBC     Status: Abnormal   Collection Time: 12/13/15 12:26 AM  Result Value Ref Range   WBC 16.8 (H) 3.6 - 11.0 K/uL   RBC 3.90 3.80 - 5.20 MIL/uL   Hemoglobin 10.1 (L) 12.0 - 16.0 g/dL   HCT 86.5 (L) 78.4 - 69.6 %   MCV 79.1 (L) 80.0 - 100.0 fL   MCH 26.0 26.0 - 34.0 pg   MCHC 32.8 32.0 - 36.0 g/dL   RDW 29.5 (H) 28.4 - 13.2 %   Platelets 224 150 - 440 K/uL  Type and screen [facility]     Status: None   Collection Time: 12/13/15 12:26 AM  Result Value Ref Range   ABO/RH(D) O NEG    Antibody Screen NEG    Sample Expiration 12/16/2015   Urine Drug Screen, Qualitative ([facility] only)     Status: Abnormal   Collection Time: 12/13/15 12:26 AM  Result Value Ref Range   Tricyclic, Ur Screen NONE DETECTED NONE DETECTED   Amphetamines, Ur Screen NONE DETECTED NONE DETECTED   MDMA (Ecstasy)Ur Screen NONE DETECTED NONE DETECTED   Cocaine Metabolite,Ur Jud NONE DETECTED NONE DETECTED   Opiate, Ur Screen NONE DETECTED NONE DETECTED   Phencyclidine (PCP) Ur S  NONE DETECTED NONE DETECTED   Cannabinoid 50 Ng, Ur [place] POSITIVE (A) NONE DETECTED   Barbiturates, Ur Screen NONE DETECTED NONE DETECTED   Benzodiazepine, Ur Scrn NONE DETECTED NONE DETECTED   Methadone Scn, Ur NONE DETECTED NONE DETECTED    Assessment : Penny Zimmerman is a 30 y.o. G3P2002 at [redacted]w[redacted]d being admitted for PROM.  Plan: Labor: Active management with Pitocin, per protocol.  IUPC and FSE placed. Can have an epidural.  FWB: Overall reassuring fetal heart tracing.  GBS negative Delivery plan: Hopeful for vaginal delivery.  Desires postpartum BTL.  Hildred Laser, MD [facility]

## 2015-12-13 NOTE — Anesthesia Procedure Notes (Signed)
Epidural Patient location during procedure: OB Start time: 12/13/2015 8:15 AM End time: 12/13/2015 8:41 AM  Staffing Anesthesiologist: Yevette EdwardsADAMS, JAMES G Resident/CRNA: Malva CoganBEANE, Khori Underberg Performed by: resident/CRNA and anesthesiologist   Preanesthetic Checklist Completed: patient identified, site marked, surgical consent, pre-op evaluation, timeout performed, IV checked, risks and benefits discussed and monitors and equipment checked  Epidural Patient position: sitting Prep: Betadine Patient monitoring: heart rate, continuous pulse ox and blood pressure Approach: midline Location: L3-L4 Injection technique: LOR saline  Needle:  Needle type: Tuohy  Needle gauge: 18 G Needle length: 9 cm and 9 Needle insertion depth: 7 cm Catheter type: closed end flexible Catheter size: 20 Guage Catheter at skin depth: 12 cm Test dose: negative and 1.5% lidocaine with Epi 1:200 K  Assessment Events: blood not aspirated, injection not painful, no injection resistance, negative IV test and no paresthesia  Additional Notes   Patient tolerated the insertion well without complications.Reason for block:procedure for pain

## 2015-12-13 NOTE — Anesthesia Preprocedure Evaluation (Signed)
Anesthesia Evaluation  Patient identified by MRN, date of birth, ID band Patient awake    Reviewed: Allergy & Precautions, H&P , NPO status , Patient's Chart, lab work & pertinent test results, reviewed documented beta blocker date and time   Airway Mallampati: III  TM Distance: >3 FB Neck ROM: full    Dental no notable dental hx. (+) Teeth Intact   Pulmonary neg pulmonary ROS, Current Smoker,    Pulmonary exam normal breath sounds clear to auscultation       Cardiovascular Exercise Tolerance: Good negative cardio ROS Normal cardiovascular exam Rhythm:regular Rate:Normal     Neuro/Psych PSYCHIATRIC DISORDERS negative neurological ROS  negative psych ROS   GI/Hepatic negative GI ROS, Neg liver ROS,   Endo/Other  negative endocrine ROS  Renal/GU negative Renal ROS  negative genitourinary   Musculoskeletal   Abdominal   Peds  Hematology negative hematology ROS (+)   Anesthesia Other Findings   Reproductive/Obstetrics (+) Pregnancy                             Anesthesia Physical Anesthesia Plan  ASA: II  Anesthesia Plan: Regional and Epidural   Post-op Pain Management:    Induction:   Airway Management Planned:   Additional Equipment:   Intra-op Plan:   Post-operative Plan:   Informed Consent: I have reviewed the patients History and Physical, chart, labs and discussed the procedure including the risks, benefits and alternatives for the proposed anesthesia with the patient or authorized representative who has indicated his/her understanding and acceptance.     Plan Discussed with: CRNA  Anesthesia Plan Comments:         Anesthesia Quick Evaluation

## 2015-12-14 ENCOUNTER — Inpatient Hospital Stay: Payer: Medicaid Other | Admitting: Certified Registered Nurse Anesthetist

## 2015-12-14 ENCOUNTER — Encounter
Admission: EM | Disposition: A | Discharge status: Home / Self Care | Payer: Self-pay | Source: Home / Self Care | Attending: Obstetrics and Gynecology

## 2015-12-14 ENCOUNTER — Encounter: Payer: Self-pay | Admitting: *Deleted

## 2015-12-14 DIAGNOSIS — Z302 Encounter for sterilization: Secondary | ICD-10-CM

## 2015-12-14 HISTORY — PX: TUBAL LIGATION: SHX77

## 2015-12-14 LAB — CBC
HCT: 32.3 % — ABNORMAL LOW (ref 35.0–47.0)
Hemoglobin: 10.5 g/dL — ABNORMAL LOW (ref 12.0–16.0)
MCH: 25.8 pg — ABNORMAL LOW (ref 26.0–34.0)
MCHC: 32.4 g/dL (ref 32.0–36.0)
MCV: 79.4 fL — ABNORMAL LOW (ref 80.0–100.0)
Platelets: 177 10*3/uL (ref 150–440)
RBC: 4.07 MIL/uL (ref 3.80–5.20)
RDW: 15.3 % — ABNORMAL HIGH (ref 11.5–14.5)
WBC: 12.4 10*3/uL — ABNORMAL HIGH (ref 3.6–11.0)

## 2015-12-14 LAB — RPR: RPR: NONREACTIVE

## 2015-12-14 LAB — FETAL SCREEN: Fetal Screen: NEGATIVE

## 2015-12-14 SURGERY — LIGATION, FALLOPIAN TUBE, POSTPARTUM
Anesthesia: General | Laterality: Bilateral | Wound class: Clean Contaminated

## 2015-12-14 MED ORDER — LACTATED RINGERS IV SOLN
INTRAVENOUS | Status: DC
  Administered 2015-12-14: 125 mL/h via INTRAVENOUS

## 2015-12-14 MED ORDER — OXYCODONE-ACETAMINOPHEN 5-325 MG PO TABS: 1.0000 | ORAL_TABLET | Freq: Four times a day (QID) | ORAL | Status: DC | PRN

## 2015-12-14 MED ORDER — SUCCINYLCHOLINE CHLORIDE 20 MG/ML IJ SOLN
INTRAMUSCULAR | Status: DC | PRN
  Administered 2015-12-14: 100 mg via INTRAVENOUS

## 2015-12-14 MED ORDER — KETOROLAC TROMETHAMINE 30 MG/ML IJ SOLN
INTRAMUSCULAR | Status: DC | PRN
  Administered 2015-12-14: 30 mg via INTRAVENOUS

## 2015-12-14 MED ORDER — PROPOFOL 10 MG/ML IV BOLUS
INTRAVENOUS | Status: DC | PRN
  Administered 2015-12-14: 150 mg via INTRAVENOUS

## 2015-12-14 MED ORDER — IBUPROFEN 800 MG PO TABS: 800.0000 mg | ORAL_TABLET | Freq: Four times a day (QID) | ORAL | Status: DC

## 2015-12-14 MED ORDER — LIDOCAINE HCL (CARDIAC) 20 MG/ML IV SOLN
INTRAVENOUS | Status: DC | PRN
  Administered 2015-12-14: 80 mg via INTRAVENOUS

## 2015-12-14 MED ORDER — DOCUSATE SODIUM 100 MG PO CAPS: 100.0000 mg | ORAL_CAPSULE | Freq: Two times a day (BID) | ORAL | Status: DC | PRN

## 2015-12-14 MED ORDER — OXYCODONE-ACETAMINOPHEN 5-325 MG PO TABS
1.0000 | ORAL_TABLET | Freq: Four times a day (QID) | ORAL | Status: DC | PRN
  Administered 2015-12-14 – 2015-12-15 (×5): 2 via ORAL
  Filled 2015-12-14 (×5): qty 2

## 2015-12-14 MED ORDER — RHO D IMMUNE GLOBULIN 1500 UNIT/2ML IJ SOSY
300.0000 ug | PREFILLED_SYRINGE | Freq: Once | INTRAMUSCULAR | Status: AC
  Administered 2015-12-14: 300 ug via INTRAVENOUS
  Filled 2015-12-14: qty 2

## 2015-12-14 MED ORDER — FENTANYL CITRATE (PF) 100 MCG/2ML IJ SOLN
INTRAMUSCULAR | Status: DC | PRN
  Administered 2015-12-14: 100 ug via INTRAVENOUS

## 2015-12-14 MED ORDER — ONDANSETRON HCL 4 MG/2ML IJ SOLN
INTRAMUSCULAR | Status: DC | PRN
  Administered 2015-12-14: 4 mg via INTRAVENOUS

## 2015-12-14 MED ORDER — FENTANYL CITRATE (PF) 100 MCG/2ML IJ SOLN
INTRAMUSCULAR | Status: AC
  Administered 2015-12-14: 25 ug via INTRAVENOUS
  Filled 2015-12-14: qty 2

## 2015-12-14 MED ORDER — BUPIVACAINE HCL 0.5 % IJ SOLN
INTRAMUSCULAR | Status: DC | PRN
  Administered 2015-12-14: 8 mL

## 2015-12-14 MED ORDER — DEXAMETHASONE SODIUM PHOSPHATE 10 MG/ML IJ SOLN
INTRAMUSCULAR | Status: DC | PRN
  Administered 2015-12-14: 10 mg via INTRAVENOUS

## 2015-12-14 MED ORDER — ROCURONIUM BROMIDE 100 MG/10ML IV SOLN
INTRAVENOUS | Status: DC | PRN
  Administered 2015-12-14: 15 mg via INTRAVENOUS
  Administered 2015-12-14: 5 mg via INTRAVENOUS

## 2015-12-14 MED ORDER — ONDANSETRON HCL 4 MG/2ML IJ SOLN: 4.0000 mg | Freq: Once | INTRAMUSCULAR | Status: DC | PRN

## 2015-12-14 MED ORDER — FENTANYL CITRATE (PF) 100 MCG/2ML IJ SOLN
25.0000 ug | INTRAMUSCULAR | Status: DC | PRN
  Administered 2015-12-14 (×4): 25 ug via INTRAVENOUS

## 2015-12-14 MED ORDER — ACETAMINOPHEN 10 MG/ML IV SOLN
INTRAVENOUS | Status: DC | PRN
  Administered 2015-12-14: 1000 mg via INTRAVENOUS

## 2015-12-14 MED ORDER — MIDAZOLAM HCL 2 MG/2ML IJ SOLN
INTRAMUSCULAR | Status: DC | PRN
  Administered 2015-12-14: 2 mg via INTRAVENOUS

## 2015-12-14 MED ORDER — KETOROLAC TROMETHAMINE 30 MG/ML IJ SOLN
INTRAMUSCULAR | Status: AC
  Filled 2015-12-14: qty 1

## 2015-12-14 MED ORDER — SUGAMMADEX SODIUM 200 MG/2ML IV SOLN
INTRAVENOUS | Status: DC | PRN
  Administered 2015-12-14: 200 mg via INTRAVENOUS

## 2015-12-14 SURGICAL SUPPLY — 34 items
BLADE SURG SZ11 CARB STEEL (BLADE) ×3 IMPLANT
CHLORAPREP W/TINT 26ML (MISCELLANEOUS) ×3 IMPLANT
CLOSURE WOUND 1/4X4 (GAUZE/BANDAGES/DRESSINGS) ×1
DRAPE LAPAROTOMY 100X77 ABD (DRAPES) ×3 IMPLANT
DRSG TEGADERM 2-3/8X2-3/4 SM (GAUZE/BANDAGES/DRESSINGS) ×3 IMPLANT
DRSG TEGADERM 4X4.75 (GAUZE/BANDAGES/DRESSINGS) ×2 IMPLANT
GAUZE SPONGE NON-WVN 2X2 STRL (MISCELLANEOUS) ×1 IMPLANT
GLOVE BIO SURGEON STRL SZ 6 (GLOVE) ×3 IMPLANT
GLOVE BIOGEL PI IND STRL 6.5 (GLOVE) ×1 IMPLANT
GLOVE BIOGEL PI INDICATOR 6.5 (GLOVE) ×2
GOWN STRL REUS W/ TWL LRG LVL3 (GOWN DISPOSABLE) ×2 IMPLANT
GOWN STRL REUS W/TWL LRG LVL3 (GOWN DISPOSABLE) ×6
KIT RM TURNOVER CYSTO AR (KITS) ×3 IMPLANT
LABEL OR SOLS (LABEL) ×3 IMPLANT
LIQUID BAND (GAUZE/BANDAGES/DRESSINGS) ×3 IMPLANT
NDL HYPO 25GX1X1/2 BEV (NEEDLE) ×1 IMPLANT
NEEDLE HYPO 25GX1X1/2 BEV (NEEDLE) ×3 IMPLANT
NS IRRIG 500ML POUR BTL (IV SOLUTION) ×3 IMPLANT
PACK BASIN MINOR ARMC (MISCELLANEOUS) ×3 IMPLANT
PAD TELFA 2X3 NADH STRL (GAUZE/BANDAGES/DRESSINGS) ×2 IMPLANT
SPONGE VERSALON 2X2 STRL (MISCELLANEOUS) ×3
STRIP CLOSURE SKIN 1/4X4 (GAUZE/BANDAGES/DRESSINGS) ×2 IMPLANT
SUT MNCRL 4-0 (SUTURE) ×3
SUT MNCRL 4-0 27XMFL (SUTURE) ×1
SUT PLAIN GUT 0 (SUTURE) ×6 IMPLANT
SUT VIC AB 0 CT1 36 (SUTURE) ×3 IMPLANT
SUT VIC AB 0 SH 27 (SUTURE) ×3 IMPLANT
SUT VIC AB 3-0 SH 27 (SUTURE) ×3
SUT VIC AB 3-0 SH 27X BRD (SUTURE) ×1 IMPLANT
SUT VIC AB 4-0 SH 27 (SUTURE) ×3
SUT VIC AB 4-0 SH 27XANBCTRL (SUTURE) IMPLANT
SUT VICRYL 0 AB UR-6 (SUTURE) ×12 IMPLANT
SUTURE MNCRL 4-0 27XMF (SUTURE) ×1 IMPLANT
SYRINGE 10CC LL (SYRINGE) ×3 IMPLANT

## 2015-12-14 NOTE — Anesthesia Procedure Notes (Signed)
Procedure Name: Intubation Date/Time: 12/14/2015 8:58 AM Performed by: Ginger CarneMICHELET, Gemayel Mascio Pre-anesthesia Checklist: Patient identified, Emergency Drugs available, Suction available, Patient being monitored and Timeout performed Patient Re-evaluated:Patient Re-evaluated prior to inductionOxygen Delivery Method: Circle system utilized Preoxygenation: Pre-oxygenation with 100% oxygen Intubation Type: IV induction Ventilation: Mask ventilation without difficulty Laryngoscope Size: Miller and 2 Grade View: Grade I Tube type: Oral Tube size: 7.0 mm Number of attempts: 1 Airway Equipment and Method: Stylet Placement Confirmation: ETT inserted through vocal cords under direct vision,  positive ETCO2 and breath sounds checked- equal and bilateral Secured at: 21 cm Dental Injury: Teeth and Oropharynx as per pre-operative assessment

## 2015-12-14 NOTE — Anesthesia Postprocedure Evaluation (Signed)
Anesthesia Post Note  Patient: Penny Zimmerman  Procedure(s) Performed: * No procedures listed *  Patient location during evaluation: Mother Baby Anesthesia Type: Epidural Level of consciousness: awake and alert, oriented and patient cooperative Pain management: pain level controlled Vital Signs Assessment: post-procedure vital signs reviewed and stable Respiratory status: spontaneous breathing, nonlabored ventilation and respiratory function stable Cardiovascular status: blood pressure returned to baseline Postop Assessment: no headache, no backache, no signs of nausea or vomiting and patient able to bend at knees Anesthetic complications: no    Last Vitals:  Filed Vitals:   12/13/15 2333 12/14/15 0428  BP: 95/40 105/51  Pulse: 78 69  Temp: 37 C 37.1 C  Resp: 18 20    Last Pain:  Filed Vitals:   12/14/15 0428  PainSc: 5                  Chiropodisttephanie Chane Magner

## 2015-12-14 NOTE — Op Note (Addendum)
Procedure(s): POST PARTUM TUBAL LIGATION Procedure Note  Penny Zimmerman female 30 y.o. 12/14/2015  Indications: The patient is a 30 y.o. [id number] female who is PPD#1 s/p SVD, desiring permanent sterilization.   Pre-operative Diagnosis:  Multiparity, undesired fertility  Post-operative Diagnosis:  Same  Surgeon: Penny LaserAnika Malaia Buchta, MD  Assistants: None  Anesthesia: General endotracheal anesthesia  ASA Class: II  Findings: Normal fallopian tubes and ovaries bilaterally.  Indications:  30 y.o. [id number] with undesired fertility,status post vaginal delivery, desires permanent sterilization.  Other reversible forms of contraception were discussed with patient; she declines all other modalities. Risks of procedure discussed with patient including but not limited to: risk of regret, permanence of method, bleeding, infection, injury to surrounding organs and need for additional procedures.  Failure risk of 1 -2 % with increased risk of ectopic gestation if pregnancy occurs was also discussed with patient.   Procedure Details: The patient was seen in the Holding Room. The risks, benefits, complications, treatment options, and expected outcomes were discussed with the patient.  The patient concurred with the proposed plan, giving informed consent.  The site of surgery properly noted/marked. The patient was taken to the Operating Room, identified as Penny Zimmerman and the procedure verified as Procedure(s) (LRB): POST PARTUM TUBAL LIGATION (Bilateral). A Time Out was held and the above information confirmed.  The patient was placed under general anesthesia without difficulty.  She was then placed in the dorsal supine position and prepped and draped in sterile fashion.  After an adequate timeout was performed, attention was turned to the patient's abdomen where a small transverse skin incision was made under the umbilical fold. The incision was then injected with 8 ml of 0.5% Sensorcaine. The incision was taken  down to the layer of fascia using the scalpel, and fascia was incised, and extended bilaterally using Mayo scissors. The peritoneum was entered in a sharp fashion.  The separated edges of the fascia were then tagged with a stitch of 0-Vicryl. Attention was then turned to the patient's uterus, and left fallopian tube was identified and followed out to the fimbriated end.  The Babcock clamp was then used to grasp the tube approximately 4 cm from the cornual region. A 3 cm segment of tube was then ligated with two free plain gut sutures using the Pomeroy method and excised, with care given to incorporate the underlying mesosalpinx. Good hemostasis was noted and the tube was returned to the abdomen.  A similar process was carried out on the right side allowing for bilateral tubal sterilization.  Good hemostasis was noted overall. The instruments were then removed from the patient's abdomen and the fascial incision was repaired with 0 Vicryl. The subcutaneous fat layer was closed with 3-0 Vicryl.  The skin was closed with a 4-0 Vicryl subcuticular stitch. The patient tolerated the procedure well.  Instrument, sponge, and needle counts were correct times two.  The patient was then taken to the recovery room awake and in stable condition.   Estimated Blood Loss:  minimal      Drains: None         Total IV Fluids:  700 ml  Specimens:  Segments of bilateral fallopian tubes         Implants: None         Complications:  None; patient tolerated the procedure well.         Disposition: PACU - hemodynamically stable.         Condition: stable   Penny LaserAnika Penny Knighton,  MD [facility]

## 2015-12-14 NOTE — Anesthesia Preprocedure Evaluation (Signed)
Anesthesia Evaluation  Patient identified by MRN, date of birth, ID band Patient awake    Reviewed: Allergy & Precautions, NPO status , Patient's Chart, lab work & pertinent test results  Airway Mallampati: I       Dental  (+) Teeth Intact   Pulmonary Current Smoker,    Pulmonary exam normal        Cardiovascular Exercise Tolerance: Good  Rhythm:Regular     Neuro/Psych    GI/Hepatic Neg liver ROS,   Endo/Other  negative endocrine ROS  Renal/GU negative Renal ROS     Musculoskeletal negative musculoskeletal ROS (+)   Abdominal Normal abdominal exam  (+)   Peds negative pediatric ROS (+)  Hematology negative hematology ROS (+)   Anesthesia Other Findings   Reproductive/Obstetrics negative OB ROS                             Anesthesia Physical Anesthesia Plan  ASA: II  Anesthesia Plan: General   Post-op Pain Management:    Induction: Intravenous  Airway Management Planned: Oral ETT  Additional Equipment:   Intra-op Plan:   Post-operative Plan: Extubation in OR  Informed Consent: I have reviewed the patients History and Physical, chart, labs and discussed the procedure including the risks, benefits and alternatives for the proposed anesthesia with the patient or authorized representative who has indicated his/her understanding and acceptance.     Plan Discussed with: CRNA  Anesthesia Plan Comments:         Anesthesia Quick Evaluation

## 2015-12-14 NOTE — Transfer of Care (Signed)
Immediate Anesthesia Transfer of Care Note  Patient: Penny Zimmerman  Procedure(s) Performed: Procedure(s): POST PARTUM TUBAL LIGATION (Bilateral)  Patient Location: PACU  Anesthesia Type:General  Level of Consciousness: alert  and oriented  Airway & Oxygen Therapy: Patient Spontanous Breathing and Patient connected to face mask oxygen  Post-op Assessment: Report given to RN and Post -op Vital signs reviewed and stable  Post vital signs: Reviewed and stable  Last Vitals:  Filed Vitals:   12/14/15 0813 12/14/15 0834  BP: 99/45 116/67  Pulse: 82 72  Temp: 36.7 C 36.4 C  Resp: 18 16    Complications: No apparent anesthesia complications

## 2015-12-14 NOTE — Anesthesia Postprocedure Evaluation (Signed)
Anesthesia Post Note  Patient: Penny Zimmerman  Procedure(s) Performed: Procedure(s) (LRB): POST PARTUM TUBAL LIGATION (Bilateral)  Patient location during evaluation: PACU Anesthesia Type: General Level of consciousness: awake Pain management: pain level controlled Vital Signs Assessment: post-procedure vital signs reviewed and stable Respiratory status: spontaneous breathing Cardiovascular status: blood pressure returned to baseline Anesthetic complications: no    Last Vitals:  Filed Vitals:   12/14/15 1043 12/14/15 1115  BP: 125/79 130/64  Pulse: 66 66  Temp: 36.6 C 37.3 C  Resp: 19 18    Last Pain:  Filed Vitals:   12/14/15 1143  PainSc: 10-Worst pain ever                 VAN STAVEREN,Tmya Wigington

## 2015-12-14 NOTE — Progress Notes (Signed)
Post Partum Day # 1, s/p SVD  Subjective: no complaints, up ad lib, voiding and tolerating PO  Objective: Temp:  [97.9 F (36.6 C)-98.7 F (37.1 C)] 98.7 F (37.1 C) (03/24 0428) Pulse Rate:  [68-118] 69 (03/24 0428) Resp:  [16-20] 20 (03/24 0428) BP: (86-132)/(38-74) 105/51 mmHg (03/24 0428) SpO2:  [98 %-100 %] 99 % (03/23 1720)  Physical Exam:  General: alert and no distress  Lungs: clear to auscultation bilaterally Breasts: normal appearance, no masses or tenderness Heart: regular rate and rhythm, S1, S2 normal, no murmur, click, rub or gallop Pelvis: Lochia: appropriate, Uterine Fundus: firm Extremities: DVT Evaluation: No evidence of DVT seen on physical exam. Negative Homan's sign. No cords or calf tenderness. No significant calf/ankle edema.   Recent Labs  12/13/15 0026 12/14/15 0402  HGB 10.1* 10.5*  HCT 30.9* 32.3*    Assessment/Plan: Plan for discharge tomorrow, Breastfeeding and Contraception desires BTL (scheduled for today) Desires circumcision for female infant, to be performed outpatient.    LOS: 1 day   Hildred LaserAnika Rakin Lemelle [facility]

## 2015-12-14 NOTE — Progress Notes (Signed)
OBSTETRICS/GYNECOLOGY PRE-OPERATIVE NOTE  Pre-Op Diagnosis: 1) Postpartum s/p SVD  2) Multiparity, desires permanent sterilization  Planned Procedure: Postpartum bilateral tubal ligation  Surgeons: Hildred LaserAnika Rhiley Tarver, MD  Anesthesia:  General  Blood: None needed but previously typed and screened.   Labs:  O NEG/NEG (03/23 0026)  CBC Latest Ref Rng 12/14/2015 12/13/2015  WBC 3.6 - 11.0 K/uL 12.4(H) 16.8(H)  Hemoglobin 12.0 - 16.0 g/dL 10.5(L) 10.1(L)  Hematocrit 35.0 - 47.0 % 32.3(L) 30.9(L)  Platelets 150 - 440 K/uL 177 224    Antibiotics: None needed   Consent:Surgical consent and tubal sterilization. Alternatives to sterilizations are documented on the surgical consent that has been signed by the patient. Patient confirms that she has been counseled about permanent sterilization on mutiple occasions during her antepartum course and during this admission. She understands the alternatives to permanents include: oral contraceptive pills, depot provera, patch, ring, intrauterine device (5 year or 10 year), Implanon, condoms and cervical caps/diaphram.  She states that she desires the procedure. Signed and on chart   Hildred LaserAnika Nyko Gell, MD [facility]

## 2015-12-15 LAB — RHOGAM INJECTION: Unit division: 0

## 2015-12-15 MED ORDER — MEASLES, MUMPS & RUBELLA VAC ~~LOC~~ INJ
0.5000 mL | INJECTION | Freq: Once | SUBCUTANEOUS | Status: AC
  Administered 2015-12-15: 0.5 mL via SUBCUTANEOUS
  Filled 2015-12-15 (×2): qty 0.5

## 2015-12-15 MED ORDER — VARICELLA VIRUS VACCINE LIVE 1350 PFU/0.5ML IJ SUSR
0.5000 mL | Freq: Once | INTRAMUSCULAR | Status: AC
  Administered 2015-12-15: 0.5 mL via SUBCUTANEOUS
  Filled 2015-12-15: qty 0.5

## 2015-12-15 NOTE — Discharge Instructions (Signed)
.  General Postpartum Discharge Instructions ° °Do not drink alcohol or take tranquilizers.  °Do not take medicine that has not been prescribed by your doctor.  °Take showers instead of baths until your doctor gives you permission to take baths.  °No sexual intercourse or placement of anything in the vagina for 6 weeks or as instructed by your doctor. °Only take prescription or over-the-counter medicines  for pain, discomfort, or fever as directed by your doctor. Take medicines (antibiotics) that kill germs if they are prescribed for you. °  °Call the office or go to the Emergency Room if:  °You feel sick to your stomach (nauseous).  °You start to throw up (vomit).  °You have trouble eating or drinking.  °You have an oral temperature above 101.  °You have constipation that is not helped by adjusting diet or increasing fluid intake. Pain medicines are a common cause of constipation.  °You have foul smelling vaginal discharge or odor.  °You have bleeding requiring changing more than 1 pad per hour. °You have any other concerns. ° °SEEK IMMEDIATE MEDICAL CARE IF:  °You have persistent dizziness.  °You have difficulty breathing or shortness of breath.  °You have an oral temperature above 102.5, not controlled by medicine.  ° ° ° ° °Follow up sooner with fever, problems breathing, pain not helped by medications, severe depression( more than just baby blues, wanting to hurt yourself or the baby), severe bleeding ( saturating more than one pad an hour or large palm sized clots), no heavy lifting , no driving while taking narcotics, no douches, intercourse, tampons or enemas for 6 weeks  °

## 2015-12-15 NOTE — Discharge Summary (Signed)
Obstetric Discharge Summary Reason for Admission: rupture of membranes Prenatal Procedures: ultrasound Intrapartum Procedures: spontaneous vaginal delivery Postpartum Procedures: P.P. tubal ligation and Rubella Ig Complications-Operative and Postpartum: none HEMOGLOBIN  Date Value Ref Range Status  12/14/2015 10.5* 12.0 - 16.0 g/dL Final   HGB  Date Value Ref Range Status  06/25/2012 10.2* 12.0-16.0 g/dL Final   HCT  Date Value Ref Range Status  12/14/2015 32.3* 35.0 - 47.0 % Final  06/27/2012 27.3* 35.0-47.0 % Final   HEMATOCRIT  Date Value Ref Range Status  09/26/2015 32.1* 34.0 - 46.6 % Final    Physical Exam:  General: alert and no distress Lochia: appropriate Uterine Fundus: firm Incision: healing well, no significant drainage, no dehiscence, no significant erythema DVT Evaluation: Negative Homan's sign.  No cords or calf tenderness. No significant calf/ankle edema.  Discharge Diagnoses: Term Pregnancy-delivered, PROM  Discharge Information: Date: 12/15/2015 Activity: pelvic rest Diet: routine Medications: PNV, Ibuprofen, Colace and Percocet Condition: stable Instructions: refer to practice specific booklet Discharge to: home Follow-up Information    Follow up with Hildred LaserAnika Keitra Carusone, MD In 6 weeks.   Specialties:  Obstetrics and Gynecology, Radiology   Why:  Postpartum visit   Contact information:   [address] [place] [address] [phone]       Newborn Data: Live born female  Birth Weight: 6 lb 7 oz (2920 g) APGAR: 9, 10  Home with mother.  Hildred Lasernika Mako Pelfrey 12/15/2015, 10:41 AM

## 2015-12-15 NOTE — Progress Notes (Addendum)
Post Partum Day # 2, s/p SVD, POD#1 s/p postpartum BTL  Subjective: up ad lib, voiding, tolerating PO and notes soreness along right side.  Objective: Temp:  [97.7 F (36.5 C)-99.1 F (37.3 C)] 98.3 F (36.8 C) (03/25 0815) Pulse Rate:  [57-75] 60 (03/25 0815) Resp:  [18-20] 18 (03/25 0815) BP: (110-130)/(56-79) 110/59 mmHg (03/25 0815) SpO2:  [96 %-100 %] 100 % (03/25 0815)  Physical Exam:  General: alert and no distress  Lungs: clear to auscultation bilaterally Breasts: normal appearance, no masses or tenderness Heart: regular rate and rhythm, S1, S2 normal, no murmur, click, rub or gallop  Abdomen: soft, nontender. Infraumbilical incision with  Pelvis: Lochia: appropriate, Uterine Fundus: firm Extremities: No evidence of DVT seen on physical exam.  Negative Homan's sign. No cords or calf tenderness. No significant calf/ankle edema.   Recent Labs  12/13/15 0026 12/14/15 0402  HGB 10.1* 10.5*  HCT 30.9* 32.3*    Assessment/Plan: Discharge home, Breastfeeding and Contraception s/p BTL Desires circumcision for female infant, to be performed outpatient.  Patient Rubella non-immune, will need vaccine prior to discharge.    LOS: 2 days   Hildred LaserAnika Shahiem Bedwell [facility]

## 2015-12-15 NOTE — Progress Notes (Signed)
All discharge instructions given to patient and she voices understanding of all instructions given.  Prescription given. Patient discharged home in wheelchair with infant in arms escorted out by cna

## 2015-12-17 LAB — SURGICAL PATHOLOGY

## 2015-12-18 ENCOUNTER — Encounter: Payer: Medicaid Other | Admitting: Obstetrics and Gynecology

## 2015-12-28 ENCOUNTER — Telehealth: Payer: Self-pay | Admitting: Obstetrics and Gynecology

## 2015-12-28 NOTE — Telephone Encounter (Signed)
Im sorry what week is there no availability?

## 2015-12-28 NOTE — Telephone Encounter (Signed)
5wks would be better but if she absolutely has to return to work at 4wks then we can see her then for a postpartum visit. Thanks

## 2015-12-28 NOTE — Telephone Encounter (Signed)
There is no availibity at wks so Ill wait till next week and see if anything cancels

## 2015-12-28 NOTE — Telephone Encounter (Signed)
18th 19th 20th

## 2015-12-28 NOTE — Telephone Encounter (Signed)
Please have the pt added to the schedule for 01/09/2016 at 11:00am. The pt that was in that slot is being cancelled. Thanks

## 2015-12-28 NOTE — Telephone Encounter (Signed)
Pt wants to go back to work at [employer]4wks post partum, she wants to know if you will see her at 4wks so she can get a note [place] back to work.

## 2016-01-09 ENCOUNTER — Encounter: Payer: Self-pay | Admitting: Obstetrics and Gynecology

## 2016-01-09 ENCOUNTER — Ambulatory Visit (INDEPENDENT_AMBULATORY_CARE_PROVIDER_SITE_OTHER): Payer: Medicaid Other | Admitting: Obstetrics and Gynecology

## 2016-01-09 DIAGNOSIS — O9081 Anemia of the puerperium: Secondary | ICD-10-CM

## 2016-01-09 DIAGNOSIS — R87613 High grade squamous intraepithelial lesion on cytologic smear of cervix (HGSIL): Secondary | ICD-10-CM

## 2016-01-09 NOTE — Progress Notes (Signed)
   OBSTETRICS POSTPARTUM CLINIC PROGRESS NOTE  Subjective:     Penny Zimmerman is a 30 y.o. [id number] female who presents for a postpartum visit. She is 4 weeks postpartum following a spontaneous vaginal delivery. I have fully reviewed the prenatal and intrapartum course. The delivery was at 39 gestational weeks.  Anesthesia: epidural. Postpartum course has been well. Baby's course has been well. Baby is feeding by bottle - Similac Sensitive RS. Bleeding: patient having intermittent spotting.  Has not yet resumed menses. Bowel function is normal. Bladder function is normal. Patient is sexually active. Contraception method desired is tubal ligation. Postpartum depression screening: negative (PHQ-9 score is a 1).  The following portions of the patient's history were reviewed and updated as appropriate: allergies, current medications, past family history, past medical history, past social history, past surgical history and problem list.  Review of Systems Pertinent items noted in HPI and remainder of comprehensive ROS otherwise negative.   Objective:    BP 117/70 mmHg  Pulse 64  Ht 5\' 8"  (1.727 m)  Wt 202 lb 8 oz (91.853 kg)  BMI 30.80 kg/m2  Breastfeeding? No  General:  alert and no distress   Breasts:  inspection negative, no nipple discharge or bleeding, no masses or nodularity palpable  Lungs: clear to auscultation bilaterally  Heart:  regular rate and rhythm, S1, S2 normal, no murmur, click, rub or gallop  Abdomen: soft, non-tender; bowel sounds normal; no masses,  no organomegaly.  Well healed Pfannenstiel incision   Vulva:  normal  Vagina: normal vagina, no discharge, exudate, lesion, or erythema  Cervix:  no cervical motion tenderness and no lesions  Corpus: normal size, contour, position, consistency, mobility, non-tender  Adnexa:  normal adnexa and no mass, fullness, tenderness  Rectal Exam: Not performed.         Labs:  Lab Results  Component Value Date   HGB 10.5* 12/14/2015       Assessment:   Routine postpartum exam.    H/o abnormal pap smear (HGSIL) Anemia, mild postpartum  Plan:   1. Contraception: postpartum tubal ligation 2. For repeat colposcopy postpartum for HGSIL pap smear.  Had initial colposcopy at [redacted] weeks gestation.  3. Anemia postpartum, mild.  Continue taking PNV with iron.  4. Follow up in: 3 weeks for colposcopy or as needed.   Work notice given.    Hildred LaserAnika Necie Wilcoxson, MD [facility]

## 2016-01-24 ENCOUNTER — Encounter: Payer: Medicaid Other | Admitting: Obstetrics and Gynecology

## 2016-04-29 ENCOUNTER — Encounter: Payer: Self-pay | Admitting: Medical Oncology

## 2016-04-29 ENCOUNTER — Emergency Department: Payer: Self-pay

## 2016-04-29 ENCOUNTER — Emergency Department
Admission: EM | Admit: 2016-04-29 | Discharge: 2016-04-29 | Disposition: A | Discharge status: Home / Self Care | Payer: Self-pay | Attending: Emergency Medicine | Admitting: Emergency Medicine

## 2016-04-29 DIAGNOSIS — N739 Female pelvic inflammatory disease, unspecified: Secondary | ICD-10-CM | POA: Insufficient documentation

## 2016-04-29 DIAGNOSIS — F17200 Nicotine dependence, unspecified, uncomplicated: Secondary | ICD-10-CM | POA: Insufficient documentation

## 2016-04-29 DIAGNOSIS — R109 Unspecified abdominal pain: Secondary | ICD-10-CM

## 2016-04-29 LAB — LIPASE, BLOOD: Lipase: 78 U/L — ABNORMAL HIGH (ref 11–51)

## 2016-04-29 LAB — COMPREHENSIVE METABOLIC PANEL
ALK PHOS: 70 U/L (ref 38–126)
ALT: 13 U/L — ABNORMAL LOW (ref 14–54)
ANION GAP: 6 (ref 5–15)
AST: 17 U/L (ref 15–41)
Albumin: 4.2 g/dL (ref 3.5–5.0)
BILIRUBIN TOTAL: 0.4 mg/dL (ref 0.3–1.2)
BUN: 7 mg/dL (ref 6–20)
CALCIUM: 9.2 mg/dL (ref 8.9–10.3)
CO2: 24 mmol/L (ref 22–32)
Chloride: 109 mmol/L (ref 101–111)
Creatinine, Ser: 0.57 mg/dL (ref 0.44–1.00)
GLUCOSE: 89 mg/dL (ref 65–99)
POTASSIUM: 3.7 mmol/L (ref 3.5–5.1)
Sodium: 139 mmol/L (ref 135–145)
TOTAL PROTEIN: 6.9 g/dL (ref 6.5–8.1)

## 2016-04-29 LAB — URINALYSIS COMPLETE WITH MICROSCOPIC (ARMC ONLY)
BILIRUBIN URINE: NEGATIVE
GLUCOSE, UA: NEGATIVE mg/dL
Hgb urine dipstick: NEGATIVE
KETONES UR: NEGATIVE mg/dL
Leukocytes, UA: NEGATIVE
Nitrite: NEGATIVE
Protein, ur: NEGATIVE mg/dL
RBC / HPF: NONE SEEN RBC/hpf (ref 0–5)
SPECIFIC GRAVITY, URINE: 1.003 — AB (ref 1.005–1.030)
pH: 7 (ref 5.0–8.0)

## 2016-04-29 LAB — CBC
HCT: 36.3 % (ref 35.0–47.0)
Hemoglobin: 12.1 g/dL (ref 12.0–16.0)
MCH: 26.5 pg (ref 26.0–34.0)
MCHC: 33.4 g/dL (ref 32.0–36.0)
MCV: 79.2 fL — ABNORMAL LOW (ref 80.0–100.0)
PLATELETS: 202 10*3/uL (ref 150–440)
RBC: 4.58 MIL/uL (ref 3.80–5.20)
RDW: 14.8 % — AB (ref 11.5–14.5)
WBC: 9.8 10*3/uL (ref 3.6–11.0)

## 2016-04-29 LAB — CHLAMYDIA/NGC RT PCR (ARMC ONLY)
CHLAMYDIA TR: NOT DETECTED
N GONORRHOEAE: NOT DETECTED

## 2016-04-29 LAB — WET PREP, GENITAL
Sperm: NONE SEEN
Trich, Wet Prep: NONE SEEN
YEAST WET PREP: NONE SEEN

## 2016-04-29 LAB — POCT PREGNANCY, URINE
Preg Test, Ur: NEGATIVE
Preg Test, Ur: NEGATIVE

## 2016-04-29 MED ORDER — MORPHINE SULFATE (PF) 4 MG/ML IV SOLN
4.0000 mg | Freq: Once | INTRAVENOUS | Status: AC
  Administered 2016-04-29: 4 mg via INTRAVENOUS
  Filled 2016-04-29: qty 1

## 2016-04-29 MED ORDER — DOXYCYCLINE HYCLATE 100 MG PO TABS
100.0000 mg | ORAL_TABLET | Freq: Once | ORAL | Status: AC
  Administered 2016-04-29: 100 mg via ORAL
  Filled 2016-04-29: qty 1

## 2016-04-29 MED ORDER — ONDANSETRON HCL 4 MG/2ML IJ SOLN
4.0000 mg | Freq: Once | INTRAMUSCULAR | Status: AC
  Administered 2016-04-29: 4 mg via INTRAVENOUS
  Filled 2016-04-29: qty 2

## 2016-04-29 MED ORDER — SODIUM CHLORIDE 0.9 % IV BOLUS (SEPSIS)
1000.0000 mL | Freq: Once | INTRAVENOUS | Status: AC
  Administered 2016-04-29: 1000 mL via INTRAVENOUS
  Filled 2016-04-29: qty 1000

## 2016-04-29 MED ORDER — DOXYCYCLINE HYCLATE 50 MG PO CAPS: 100.0000 mg | ORAL_CAPSULE | Freq: Two times a day (BID) | ORAL | 0 refills | Status: DC

## 2016-04-29 MED ORDER — DEXTROSE 5 % IV SOLN
1.0000 g | Freq: Once | INTRAVENOUS | Status: AC
  Administered 2016-04-29: 1 g via INTRAVENOUS
  Filled 2016-04-29: qty 10

## 2016-04-29 MED ORDER — IBUPROFEN 600 MG PO TABS: 600.0000 mg | ORAL_TABLET | Freq: Three times a day (TID) | ORAL | 0 refills | Status: DC | PRN

## 2016-04-29 MED ORDER — METRONIDAZOLE 500 MG PO TABS: 500.0000 mg | ORAL_TABLET | Freq: Two times a day (BID) | ORAL | 0 refills | Status: DC

## 2016-04-29 MED ORDER — METRONIDAZOLE 500 MG PO TABS
500.0000 mg | ORAL_TABLET | Freq: Once | ORAL | Status: AC
  Administered 2016-04-29: 500 mg via ORAL
  Filled 2016-04-29: qty 1

## 2016-04-29 NOTE — ED Provider Notes (Signed)
-----------------------------------------   4:19 PM on 04/29/2016 -----------------------------------------  The patient's ultrasound has resulted in largely within normal limits. Per Dr. Merlene PullingLord's discharge instructions we'll discharge with doxycycline covering for pelvic inflammatory disease. Patient's only other lab abnormality was a slightly elevated lipase however the patient's discomfort is only lower abdomen. Patient will be discharged with doxycycline, Flagyl and ibuprofen. Patient to follow up with OB/GYN. I discussed this plan of care with the patient who is agreeable.   Minna AntisKevin Schylar Allard, MD 04/29/16 [phone]

## 2016-04-29 NOTE — ED Provider Notes (Signed)
[facility] Emergency Department Provider Note ____________________________________________  Time seen: Approximately 9:30 AM I have reviewed the triage vital signs and the triage nursing note.  HISTORY  Chief Complaint Flank Pain and Abdominal Pain   Historian Patient  HPI Penny Zimmerman is a 30 y.o. female with a history of tubal ligation presenting today for 5 days of bilateral flank pain which is been nearly constant and moderate in intensity without known trauma or overuse injury, and then also intermittent lower abdominal/pelvic pains for the past few days as well. She thought that she was coming on her period, however she has not started her period. She's never had pains like this before and this morning the abdominal pain was so bad she thought she might pass out. Denies dysuria. Denies hematuria. Denies fever. Denies diarrhea or constipation. Denies vomiting. Pain is currently moderate to severe and is mostly in her back as the abdominal pain is coming going to the bilateral flank pain is constant. She's been taking 2 tablets of regular strength ibuprofen at home for pain including this morning. She's also been taking Tylenol.    Past Medical History:  Diagnosis Date  . Depression 10/24/2015  . 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  For repeat postpartum.      Patient Active Problem List   Diagnosis Date Noted  . Marijuana smoker, episodic (HCC) 12/04/2015  . Depression 10/24/2015  . 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 NO PAST SURGERIES    . TUBAL LIGATION Bilateral 12/14/2015   Procedure: POST PARTUM TUBAL LIGATION;  Surgeon: Hildred Laser, MD;  Location: [facility] ORS;  Service: Gynecology;  Laterality: Bilateral;    Prior to Admission medications   Medication Sig Start Date End Date Taking? Authorizing Provider   doxycycline (VIBRAMYCIN) 50 MG capsule Take 2 capsules (100 mg total) by mouth 2 (two) times daily. 04/29/16   Governor Rooks, MD  metroNIDAZOLE (FLAGYL) 500 MG tablet Take 1 tablet (500 mg total) by mouth 2 (two) times daily. 04/29/16   Governor Rooks, MD    No Known Allergies  Family History  Problem Relation Age of Onset  . Diabetes Father   . Hypertension Father   . Hyperlipidemia Father   . Diabetes Paternal Grandmother     Social History Social History  Substance Use Topics  . Smoking status: Current Some Day Smoker    Packs/day: 0.50    Years: 10.00  . Smokeless tobacco: Never Used  . Alcohol use No    Review of Systems  Constitutional: Negative for fever. Eyes: Negative for visual changes. ENT: Negative for sore throat. Cardiovascular: Negative for chest pain. Respiratory: Negative for shortness of breath. Gastrointestinal: Negative for  vomiting and diarrhea. Genitourinary: Negative for dysuria. Musculoskeletal: Positive for back pain. Skin: Negative for rash. Neurological: Negative for headache. 10 point Review of Systems otherwise negative ____________________________________________   PHYSICAL EXAM:  VITAL SIGNS: ED Triage Vitals  Enc Vitals Group     BP 04/29/16 0846 134/77     Pulse Rate 04/29/16 0846 71     Resp 04/29/16 0846 16     Temp 04/29/16 0846 98.4 F (36.9 C)     Temp Source 04/29/16 0846 Oral     SpO2 04/29/16 0846 99 %     Weight 04/29/16 0842 190 lb (86.2 kg)     Height 04/29/16 0842 5\' 8"  (1.727 m)  Head Circumference --      Peak Flow --      Pain Score 04/29/16 0843 10     Pain Loc --      Pain Edu? --      Excl. in GC? --      Constitutional: Alert and oriented. Well appearingOverall but tearful due to pain in her back. HEENT   Head: Normocephalic and atraumatic.      Eyes: Conjunctivae are normal. PERRL. Normal extraocular movements.      Ears:         Nose: No congestion/rhinnorhea.   Mouth/Throat: Mucous  membranes are moist.   Neck: No stridor. Cardiovascular/Chest: Normal rate, regular rhythm.  No murmurs, rubs, or gallops. Respiratory: Normal respiratory effort without tachypnea nor retractions. Breath sounds are clear and equal bilaterally. No wheezes/rales/rhonchi. Gastrointestinal: Soft. No distention, no guarding, no rebound. Mild tenderness in the pelvic area/suprapubic area.  Genitourinary/rectal: Small amount of white discharge. Cervical motion tenderness. No adnexal mass. Musculoskeletal: Paraspinous musculature/flank bilaterally tender to palpation.  Nontender with normal range of motion in all extremities. No joint effusions.  No lower extremity tenderness.  No edema. Neurologic:  Normal speech and language. No gross or focal neurologic deficits are appreciated. Skin:  Skin is warm, dry and intact. No rash noted. Psychiatric: Mood and affect are normal. Speech and behavior are normal. Patient exhibits appropriate insight and judgment.  ____________________________________________   EKG I, Governor Rooks, MD, the attending physician have personally viewed and interpreted all ECGs.  None ____________________________________________  LABS (pertinent positives/negatives)  Labs Reviewed  WET PREP, GENITAL - Abnormal; Notable for the following:       Result Value   Clue Cells Wet Prep HPF POC PRESENT (*)    WBC, Wet Prep HPF POC FEW (*)    All other components within normal limits  COMPREHENSIVE METABOLIC PANEL - Abnormal; Notable for the following:    ALT 13 (*)    All other components within normal limits  CBC - Abnormal; Notable for the following:    MCV 79.2 (*)    RDW 14.8 (*)    All other components within normal limits  URINALYSIS COMPLETEWITH MICROSCOPIC ([facility] ONLY) - Abnormal; Notable for the following:    Color, Urine STRAW (*)    APPearance CLEAR (*)    Specific Gravity, Urine 1.003 (*)    Bacteria, UA RARE (*)    Squamous Epithelial / LPF 0-5 (*)    All  other components within normal limits  LIPASE, BLOOD - Abnormal; Notable for the following:    Lipase 78 (*)    All other components within normal limits  CHLAMYDIA/NGC RT PCR ([facility] ONLY)  POC URINE PREG, ED  POCT PREGNANCY, URINE  POCT PREGNANCY, URINE    ____________________________________________  RADIOLOGY All Xrays were viewed by me. Imaging interpreted by Radiologist.  Ultrasound pelvic and transvaginal with Doppler: Pending __________________________________________  PROCEDURES  Procedure(s) performed: None  Critical Care performed: None  ____________________________________________   ED COURSE / ASSESSMENT AND PLAN  Pertinent labs & imaging results that were available during my care of the patient were reviewed by me and considered in my medical decision making (see chart for details).   Ms. Vanderschaaf is here for flank pain and abdominal pain, and when I asked her which one seems to worse, it sounds like the back pain is most concerning to her right now, but the abdominal pain when it is intense is more severe, but that has been coming and  going.  On exam her abdomen is soft and mildly tender in the suprapubic area, but her bilateral flanks are exquisitely tender to palpation. The flank pain seems more likely musculoskeletal based on my exam. I don't think that she is likely been taking maximal dosing on the ibuprofen at home.   Patient continues to complain of lower abdominal pain and is questioning whether or not she could have PID.  I did perform pelvic exam, and she does have cervicitis, but no real suspicious vaginal discharge for PID. In any case I will go ahead and treat her with antibiotic coverage for this. Patient was given 1 g of IV Rocephin and doxycycline 100 mg as well as 500 mg of metronidazole.  I did discuss with her obtaining ultrasound for further evaluation. I have a low suspicion for more serious infection or TOA with a normal white blood  cell count and not febrile, however I will go ahead and image.  Patient care transferred to Dr. Lenard LancePaduchowski at shift change, 3:30 PM. Ultrasound pending. If reassuring, I have prepared discharge instructions for outpatient treatment of PID.    CONSULTATIONS:  None   Patient / Family / Caregiver informed of clinical course, medical decision-making process, and agree with plan.   I discussed return precautions, follow-up instructions, and discharged instructions with patient and/or family.   ___________________________________________   FINAL CLINICAL IMPRESSION(S) / ED DIAGNOSES   Final diagnoses:  Flank pain  Pelvic inflammatory disease (PID)              Note: This dictation was prepared with Dragon dictation. Any transcriptional errors that result from this process are unintentional    Governor Rooksebecca Darlette Dubow, MD 04/29/16 1546

## 2016-04-29 NOTE — Discharge Instructions (Signed)
You were evaluated for flank pain and pelvic pain, and are being treated for pelvic inflammatory disease with antibiotics.  As we discussed, we chose to hold off on CT scan of the abdomen at this point in time.  Return to the emergency department for any worsening condition including fever, vomiting cannot tolerate medications, dizziness or passing out, black or bloody stools, or any other symptoms concerning to you.

## 2016-04-29 NOTE — ED Triage Notes (Signed)
Pt reports that she has been having lower abd pressure/cramping x 5 days with bilt flank pain. Denies dysuria.

## 2018-02-18 ENCOUNTER — Ambulatory Visit: Payer: Self-pay | Admitting: Physician Assistant

## 2018-05-18 ENCOUNTER — Emergency Department
Admission: EM | Admit: 2018-05-18 | Discharge: 2018-05-19 | Disposition: A | Discharge status: Other Acute Inpatient Hospital | Payer: Medicaid Other | Attending: Emergency Medicine | Admitting: Emergency Medicine

## 2018-05-18 ENCOUNTER — Other Ambulatory Visit: Payer: Self-pay

## 2018-05-18 ENCOUNTER — Encounter: Payer: Self-pay | Admitting: Emergency Medicine

## 2018-05-18 DIAGNOSIS — F17210 Nicotine dependence, cigarettes, uncomplicated: Secondary | ICD-10-CM | POA: Diagnosis not present

## 2018-05-18 DIAGNOSIS — G8929 Other chronic pain: Secondary | ICD-10-CM

## 2018-05-18 DIAGNOSIS — Z008 Encounter for other general examination: Secondary | ICD-10-CM | POA: Diagnosis not present

## 2018-05-18 DIAGNOSIS — F314 Bipolar disorder, current episode depressed, severe, without psychotic features: Secondary | ICD-10-CM | POA: Diagnosis present

## 2018-05-18 DIAGNOSIS — R45851 Suicidal ideations: Secondary | ICD-10-CM | POA: Diagnosis not present

## 2018-05-18 DIAGNOSIS — R109 Unspecified abdominal pain: Secondary | ICD-10-CM | POA: Diagnosis present

## 2018-05-18 DIAGNOSIS — Z79899 Other long term (current) drug therapy: Secondary | ICD-10-CM | POA: Diagnosis not present

## 2018-05-18 DIAGNOSIS — R51 Headache: Secondary | ICD-10-CM | POA: Insufficient documentation

## 2018-05-18 DIAGNOSIS — F1120 Opioid dependence, uncomplicated: Secondary | ICD-10-CM | POA: Insufficient documentation

## 2018-05-18 DIAGNOSIS — F1420 Cocaine dependence, uncomplicated: Secondary | ICD-10-CM

## 2018-05-18 DIAGNOSIS — F332 Major depressive disorder, recurrent severe without psychotic features: Secondary | ICD-10-CM | POA: Diagnosis not present

## 2018-05-18 DIAGNOSIS — F1910 Other psychoactive substance abuse, uncomplicated: Secondary | ICD-10-CM

## 2018-05-18 LAB — COMPREHENSIVE METABOLIC PANEL
ALBUMIN: 4.5 g/dL (ref 3.5–5.0)
ALT: 14 U/L (ref 0–44)
AST: 20 U/L (ref 15–41)
Alkaline Phosphatase: 60 U/L (ref 38–126)
Anion gap: 7 (ref 5–15)
BUN: 8 mg/dL (ref 6–20)
CHLORIDE: 105 mmol/L (ref 98–111)
CO2: 25 mmol/L (ref 22–32)
Calcium: 9.2 mg/dL (ref 8.9–10.3)
Creatinine, Ser: 0.56 mg/dL (ref 0.44–1.00)
GFR calc Af Amer: >60 mL/min (ref >60)
GLUCOSE: 94 mg/dL (ref 70–99)
POTASSIUM: 3.3 mmol/L — AB (ref 3.5–5.1)
SODIUM: 137 mmol/L (ref 135–145)
Total Bilirubin: 0.5 mg/dL (ref 0.3–1.2)
Total Protein: 7.2 g/dL (ref 6.5–8.1)

## 2018-05-18 LAB — CBC WITH DIFFERENTIAL/PLATELET
Basophils Absolute: 0 10*3/uL (ref 0–0.1)
Basophils Relative: 0 %
Eosinophils Absolute: 0.4 10*3/uL (ref 0–0.7)
Eosinophils Relative: 5 %
HCT: 32.2 % — ABNORMAL LOW (ref 35.0–47.0)
HEMOGLOBIN: 10.8 g/dL — AB (ref 12.0–16.0)
Lymphocytes Relative: 39 %
Lymphs Abs: 3 10*3/uL (ref 1.0–3.6)
MCH: 25.1 pg — AB (ref 26.0–34.0)
MCHC: 33.4 g/dL (ref 32.0–36.0)
MCV: 75.1 fL — ABNORMAL LOW (ref 80.0–100.0)
Monocytes Absolute: 0.6 10*3/uL (ref 0.2–0.9)
Monocytes Relative: 8 %
NEUTROS PCT: 48 %
Neutro Abs: 3.8 10*3/uL (ref 1.4–6.5)
Platelets: 243 10*3/uL (ref 150–440)
RBC: 4.29 MIL/uL (ref 3.80–5.20)
RDW: 17.6 % — ABNORMAL HIGH (ref 11.5–14.5)
WBC: 7.9 10*3/uL (ref 3.6–11.0)

## 2018-05-18 LAB — WET PREP, GENITAL
SPERM: NONE SEEN
Trich, Wet Prep: NONE SEEN
Yeast Wet Prep HPF POC: NONE SEEN

## 2018-05-18 LAB — CHLAMYDIA/NGC RT PCR (ARMC ONLY)
Chlamydia Tr: NOT DETECTED
N gonorrhoeae: NOT DETECTED

## 2018-05-18 LAB — ETHANOL

## 2018-05-18 LAB — SALICYLATE LEVEL: Salicylate Lvl: <7 mg/dL (ref 2.8–30.0)

## 2018-05-18 LAB — ACETAMINOPHEN LEVEL: Acetaminophen (Tylenol), Serum: <10 ug/mL — ABNORMAL LOW (ref 10–30)

## 2018-05-18 NOTE — ED Notes (Signed)
Pt states she just used the restroom, unable to provide urine specimen at this time.

## 2018-05-18 NOTE — ED Notes (Signed)
TTS to bedside. 

## 2018-05-18 NOTE — ED Notes (Signed)
Pelvic cart set up at bedside  
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
## 2018-05-18 NOTE — ED Notes (Signed)
SOC called to [place].

## 2018-05-18 NOTE — ED Provider Notes (Signed)
[facility] Emergency Department Provider Note  ____________________________________________  Time seen: Approximately 9:40 PM  I have reviewed the triage vital signs and the nursing notes.   HISTORY  Chief Complaint Addiction Problem; Suicidal; and Abdominal Pain    HPI Penny Zimmerman is a 32 y.o. female with a history of depression and abnormal Pap smear comes to the ED tonight requesting help for her drug abuse.  Reports that in the past she has used any drugs that she can get access to including most commonly heroin but also cocaine, PCP, marijuana, pills.  Last heroin was last night.  Last cocaine was a month ago.  Associated with depressed mood, racing thoughts, difficulty sleeping, irregular appetite.  Has suicidal ideation, no plan or intent to die.  No HI or hallucinations.  Also complains of chronic abdominal pain ongoing for the past 3 years which she attributes to BV.  Also reports some unusual vaginal bleeding and discharge currently.  No urinary symptoms.  Denies alcohol use.   Reports a history of IV heroin use but last injection was 5 months ago, she normally injects at her wrist.  Sexually active only with her spouse who accompanies her in the ED.   Past Medical History:  Diagnosis Date  . Depression 10/24/2015  . 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  For repeat postpartum.       Patient Active Problem List   Diagnosis Date Noted  . Marijuana smoker, episodic 12/04/2015  . Depression 10/24/2015  . 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 NO PAST SURGERIES    . TUBAL LIGATION Bilateral 12/14/2015   Procedure: POST PARTUM TUBAL LIGATION;  Surgeon: Hildred LaserAnika Cherry, MD;  Location: [facility] ORS;  Service: Gynecology;  Laterality: Bilateral;     Prior to Admission medications   Medication Sig Start Date End Date  Taking? Authorizing Provider  doxycycline (VIBRAMYCIN) 50 MG capsule Take 2 capsules (100 mg total) by mouth 2 (two) times daily. 04/29/16   Governor RooksLord, Rebecca, MD  ibuprofen (ADVIL,MOTRIN) 600 MG tablet Take 1 tablet (600 mg total) by mouth every 8 (eight) hours as needed. 04/29/16   Governor RooksLord, Rebecca, MD  metroNIDAZOLE (FLAGYL) 500 MG tablet Take 1 tablet (500 mg total) by mouth 2 (two) times daily. 04/29/16   Governor RooksLord, Rebecca, MD     Allergies Patient has no known allergies.   Family History  Problem Relation Age of Onset  . Diabetes Father   . Hypertension Father   . Hyperlipidemia Father   . Diabetes Paternal Grandmother     Social History Social History   Tobacco Use  . Smoking status: Current Some Day Smoker    Packs/day: 0.50    Years: 10.00    Pack years: 5.00  . Smokeless tobacco: Never Used  Substance Use Topics  . Alcohol use: No    Alcohol/week: 0.0 standard drinks  . Drug use: No    Review of Systems  Constitutional:   No fever or chills.  Cardiovascular:   No chest pain or syncope. Respiratory:   No dyspnea or cough. Gastrointestinal: Positive as above for diffuse lower abdominal pain without vomiting and diarrhea.  Musculoskeletal:   Negative for focal pain or swelling All other systems reviewed and are negative except as documented above in ROS and HPI.  ____________________________________________   PHYSICAL EXAM:  VITAL SIGNS: ED Triage Vitals  Enc Vitals  Group     BP 05/18/18 2045 123/86     Pulse Rate 05/18/18 2045 66     Resp 05/18/18 2045 18     Temp 05/18/18 2045 98 F (36.7 C)     Temp Source 05/18/18 2045 Oral     SpO2 05/18/18 2045 100 %     Weight 05/18/18 2046 182 lb (82.6 kg)     Height 05/18/18 2046 5\' 8"  (1.727 m)     Head Circumference --      Peak Flow --      Pain Score 05/18/18 2045 10     Pain Loc --      Pain Edu? --      Excl. in GC? --     Vital signs reviewed, nursing assessments reviewed.   Constitutional:   Alert and  oriented. Non-toxic appearance.  Depressed affect Eyes:   Conjunctivae are normal. EOMI. PERRL. ENT      Head:   Normocephalic and atraumatic.      Nose:   No congestion/rhinnorhea.       Mouth/Throat:   MMM, no pharyngeal erythema. No peritonsillar mass.       Neck:   No meningismus. Full ROM. Hematological/Lymphatic/Immunilogical:   No cervical lymphadenopathy. Cardiovascular:   RRR. Symmetric bilateral radial and DP pulses.  No murmurs. Cap refill less than 2 seconds. Respiratory:   Normal respiratory effort without tachypnea/retractions. Breath sounds are clear and equal bilaterally. No wheezes/rales/rhonchi. Gastrointestinal:   Soft and nontender. Non distended. There is no CVA tenderness.  No rebound, rigidity, or guarding. Genitourinary:   Performed with nurse Morrie Sheldon at bedside.  External exam unremarkable.  Speculum exam shows erythema around the cervix, small amount of white discharge.  Bimanual exam negative for CMT or adnexal tenderness or mass. Musculoskeletal:   Normal range of motion in all extremities. No joint effusions.  No lower extremity tenderness.  No edema. Neurologic:   Normal speech and language.  Motor grossly intact. No acute focal neurologic deficits are appreciated.  Skin:    Skin is warm, dry and intact. No rash noted.  No petechiae, purpura, or bullae.  ____________________________________________    LABS (pertinent positives/negatives) (all labs ordered are listed, but only abnormal results are displayed) Labs Reviewed  CHLAMYDIA/NGC RT PCR ([facility] ONLY)  WET PREP, GENITAL  URINALYSIS, COMPLETE (UACMP) WITH MICROSCOPIC  COMPREHENSIVE METABOLIC PANEL  ETHANOL  SALICYLATE LEVEL  ACETAMINOPHEN LEVEL  CBC WITH DIFFERENTIAL/PLATELET  URINE DRUG SCREEN, QUALITATIVE ([facility] ONLY)  POC URINE PREG, ED   ____________________________________________   EKG    ____________________________________________    RADIOLOGY  No results  found.  ____________________________________________   PROCEDURES Procedures  ____________________________________________    CLINICAL IMPRESSION / ASSESSMENT AND PLAN / ED COURSE  Pertinent labs & imaging results that were available during my care of the patient were reviewed by me and considered in my medical decision making (see chart for details).    Patient presents with polysubstance abuse and vague SI.  Not an imminent danger to herself or others.  We will follow-up swabs from pelvic exam for possible antibiotic needs.  Recommended psychiatry consultation to which the patient agrees.  She is motivated to seek help for her substance abuse at this time.  No habitual use of alcohol benzodiazepines or recent cocaine use, no evidence of dangerous intoxication or withdrawal syndrome at this time.  Vital signs are normal.      ____________________________________________   FINAL CLINICAL IMPRESSION(S) / ED DIAGNOSES    Final  diagnoses:  Polysubstance abuse (HCC)  Chronic abdominal pain     ED Discharge Orders    None      Portions of this note were generated with dragon dictation software. Dictation errors may occur despite best attempts at proofreading.    Sharman Cheek, MD 05/18/18 2147

## 2018-05-18 NOTE — ED Triage Notes (Signed)
Pt arrived to the ED accompanied by her husband for complaints of abdominal pain, SI and heroin dependency. Pt states that she believed that she is bipolar and need to get help. Pt also reports that she has been experiencing severe abdominal pain and has not seen anyone for but because of fear of it being cancer. Pt is AOx4 crying during triage.

## 2018-05-18 NOTE — ED Notes (Signed)
[facility]

## 2018-05-19 ENCOUNTER — Encounter: Payer: Self-pay | Admitting: Psychiatry

## 2018-05-19 ENCOUNTER — Other Ambulatory Visit: Payer: Self-pay

## 2018-05-19 ENCOUNTER — Inpatient Hospital Stay
Admission: AD | Admit: 2018-05-19 | Discharge: 2018-05-24 | DRG: 885 | Disposition: A | Discharge status: Home / Self Care | Payer: Medicaid Other | Source: Intra-hospital | Attending: Psychiatry | Admitting: Psychiatry

## 2018-05-19 DIAGNOSIS — F314 Bipolar disorder, current episode depressed, severe, without psychotic features: Secondary | ICD-10-CM | POA: Diagnosis present

## 2018-05-19 DIAGNOSIS — F1123 Opioid dependence with withdrawal: Secondary | ICD-10-CM | POA: Diagnosis not present

## 2018-05-19 DIAGNOSIS — F1220 Cannabis dependence, uncomplicated: Secondary | ICD-10-CM | POA: Diagnosis present

## 2018-05-19 DIAGNOSIS — R109 Unspecified abdominal pain: Secondary | ICD-10-CM | POA: Diagnosis not present

## 2018-05-19 DIAGNOSIS — G4700 Insomnia, unspecified: Secondary | ICD-10-CM | POA: Diagnosis present

## 2018-05-19 DIAGNOSIS — F1120 Opioid dependence, uncomplicated: Secondary | ICD-10-CM | POA: Diagnosis not present

## 2018-05-19 DIAGNOSIS — F17200 Nicotine dependence, unspecified, uncomplicated: Secondary | ICD-10-CM

## 2018-05-19 DIAGNOSIS — R45851 Suicidal ideations: Secondary | ICD-10-CM | POA: Diagnosis present

## 2018-05-19 DIAGNOSIS — F1420 Cocaine dependence, uncomplicated: Secondary | ICD-10-CM | POA: Diagnosis present

## 2018-05-19 DIAGNOSIS — Z818 Family history of other mental and behavioral disorders: Secondary | ICD-10-CM

## 2018-05-19 DIAGNOSIS — F4010 Social phobia, unspecified: Secondary | ICD-10-CM | POA: Diagnosis present

## 2018-05-19 DIAGNOSIS — F429 Obsessive-compulsive disorder, unspecified: Secondary | ICD-10-CM | POA: Diagnosis present

## 2018-05-19 DIAGNOSIS — F17210 Nicotine dependence, cigarettes, uncomplicated: Secondary | ICD-10-CM | POA: Diagnosis present

## 2018-05-19 DIAGNOSIS — R41843 Psychomotor deficit: Secondary | ICD-10-CM | POA: Diagnosis present

## 2018-05-19 DIAGNOSIS — F332 Major depressive disorder, recurrent severe without psychotic features: Secondary | ICD-10-CM

## 2018-05-19 DIAGNOSIS — F4310 Post-traumatic stress disorder, unspecified: Secondary | ICD-10-CM | POA: Diagnosis present

## 2018-05-19 DIAGNOSIS — F410 Panic disorder [episodic paroxysmal anxiety] without agoraphobia: Secondary | ICD-10-CM | POA: Diagnosis present

## 2018-05-19 DIAGNOSIS — Z79899 Other long term (current) drug therapy: Secondary | ICD-10-CM

## 2018-05-19 LAB — URINALYSIS, COMPLETE (UACMP) WITH MICROSCOPIC
BILIRUBIN URINE: NEGATIVE
Bacteria, UA: NONE SEEN
GLUCOSE, UA: NEGATIVE mg/dL
KETONES UR: NEGATIVE mg/dL
NITRITE: NEGATIVE
PH: 5 (ref 5.0–8.0)
Protein, ur: NEGATIVE mg/dL
SPECIFIC GRAVITY, URINE: 1.018 (ref 1.005–1.030)

## 2018-05-19 LAB — URINE DRUG SCREEN, QUALITATIVE (ARMC ONLY)
Amphetamines, Ur Screen: NOT DETECTED
BARBITURATES, UR SCREEN: NOT DETECTED
COCAINE METABOLITE, UR ~~LOC~~: POSITIVE — AB
Cannabinoid 50 Ng, Ur ~~LOC~~: POSITIVE — AB
MDMA (Ecstasy)Ur Screen: NOT DETECTED
METHADONE SCREEN, URINE: NOT DETECTED
Opiate, Ur Screen: POSITIVE — AB
Phencyclidine (PCP) Ur S: NOT DETECTED
TRICYCLIC, UR SCREEN: NOT DETECTED

## 2018-05-19 MED ORDER — TRAZODONE HCL 100 MG PO TABS
100.0000 mg | ORAL_TABLET | Freq: Every evening | ORAL | Status: DC | PRN
  Administered 2018-05-19 – 2018-05-23 (×4): 100 mg via ORAL
  Filled 2018-05-19 (×4): qty 1

## 2018-05-19 MED ORDER — TRAZODONE HCL 100 MG PO TABS: 100.0000 mg | ORAL_TABLET | Freq: Every day | ORAL | Status: DC

## 2018-05-19 MED ORDER — HYDROXYZINE HCL 25 MG PO TABS: 50.0000 mg | ORAL_TABLET | Freq: Four times a day (QID) | ORAL | Status: DC | PRN

## 2018-05-19 MED ORDER — ACETAMINOPHEN 500 MG PO TABS
1000.0000 mg | ORAL_TABLET | Freq: Once | ORAL | Status: AC
  Administered 2018-05-19: 1000 mg via ORAL
  Filled 2018-05-19: qty 2

## 2018-05-19 MED ORDER — NICOTINE 21 MG/24HR TD PT24
21.0000 mg | MEDICATED_PATCH | Freq: Every day | TRANSDERMAL | Status: DC
  Administered 2018-05-20 – 2018-05-24 (×5): 21 mg via TRANSDERMAL
  Filled 2018-05-19 (×5): qty 1

## 2018-05-19 MED ORDER — NICOTINE 21 MG/24HR TD PT24
21.0000 mg | MEDICATED_PATCH | Freq: Every day | TRANSDERMAL | Status: DC
  Administered 2018-05-19: 21 mg via TRANSDERMAL
  Filled 2018-05-19: qty 1

## 2018-05-19 MED ORDER — ALUM & MAG HYDROXIDE-SIMETH 200-200-20 MG/5ML PO SUSP: 30.0000 mL | ORAL | Status: DC | PRN

## 2018-05-19 MED ORDER — ACETAMINOPHEN 325 MG PO TABS: 650.0000 mg | ORAL_TABLET | Freq: Four times a day (QID) | ORAL | Status: DC | PRN

## 2018-05-19 MED ORDER — HYDROXYZINE HCL 50 MG PO TABS
50.0000 mg | ORAL_TABLET | Freq: Three times a day (TID) | ORAL | Status: DC | PRN
  Administered 2018-05-19 – 2018-05-20 (×2): 50 mg via ORAL
  Filled 2018-05-19 (×3): qty 1

## 2018-05-19 MED ORDER — MAGNESIUM HYDROXIDE 400 MG/5ML PO SUSP: 30.0000 mL | Freq: Every day | ORAL | Status: DC | PRN

## 2018-05-19 NOTE — BH Assessment (Signed)
Patient is to be [facility] by Dr. Jennet MaduroPucilowska.  Attending Physician will be Dr. Jennet MaduroPucilowska.   Patient [facility] LaredoGwen F.   Intake Paper Work has been signed and placed on patient chart.  ER staff is aware of the admission:  [facility]   Dr. Rance MuirWillams, ER MD   Toniann FailWendy Patient's Nurse   Ethelene BrownsAnthony Patient Access.

## 2018-05-19 NOTE — ED Notes (Signed)
This RN to bedside w/ EDP at this time to provide patient and family with update.  Patient and family made aware of behavioral protocols.  Pt and family express verbal understanding.  Pt calm, cooperative.

## 2018-05-19 NOTE — ED Notes (Signed)
Dressed pt out in [place] scrubs from a hospital gown.  Pts husband had all pts belongings bagged up and he is taking them home.  Pt has a pair of glasses with her.

## 2018-05-19 NOTE — ED Provider Notes (Signed)
Center the patient from Dr. Scotty CourtStafford at 11:00 PM.  [facility] with [facility] admission   Darci CurrentBrown, Monroe N, MD 05/19/18 [id number]

## 2018-05-19 NOTE — ED Notes (Signed)
Urine sample taken to the lab. Patient is calm and cooperative, no signs of distress.

## 2018-05-19 NOTE — BHH Suicide Risk Assessment (Signed)
[facility]   Nursing information obtained from:  Patient Demographic factors:  Caucasian Current Mental Status:  NA Loss Factors:  Decline in physical health Historical Factors:  NA Risk Reduction Factors:  Responsible for children under 31 years of age, Sense of responsibility to family, Living with another person, especially a relative  Total Time spent with patient: 1 hour Principal Problem: Bipolar I disorder, most recent episode depressed, severe without psychotic features (HCC) Diagnosis:   Patient Active Problem List   Diagnosis Date Noted  . Bipolar I disorder, most recent episode depressed, severe without psychotic features (HCC) [F31.4] 05/19/2018    Priority: High  . Opioid use disorder, moderate, dependence (HCC) [F11.20] 05/19/2018  . Cocaine use disorder, moderate, dependence (HCC) [F14.20] 05/19/2018  . Tobacco use disorder [F17.200] 05/19/2018  . Cannabis use disorder, moderate, dependence (HCC) [F12.20] 12/04/2015  . Depression [F32.9] 10/24/2015  . HGSIL (high grade squamous intraepithelial dysplasia) [IMO0002] 06/12/2015   Subjective Data: suicidal ideation.  Continued Clinical Symptoms:  Alcohol Use Disorder Identification Test Final Score (AUDIT): 2 The "Alcohol Use Disorders Identification Test", Guidelines for Use in Primary Care, Second Edition.  World Science writerHealth Organization [facility](WHO). Score between 0-7:  no or low risk or alcohol related problems. Score between 8-15:  moderate risk of alcohol related problems. Score between 16-19:  high risk of alcohol related problems. Score 20 or above:  warrants further diagnostic evaluation for alcohol dependence and treatment.   CLINICAL FACTORS:   Bipolar Disorder:   Depressive phase Depression:   Comorbid alcohol abuse/dependence Impulsivity Insomnia Alcohol/Substance Abuse/Dependencies Obsessive-Compulsive Disorder   Musculoskeletal: Strength & Muscle Tone: within normal limits Gait &  Station: normal Patient leans: N/A  Psychiatric Specialty Exam: Physical Exam  Nursing note and vitals reviewed. Psychiatric: Her speech is normal. Her affect is blunt. She is withdrawn. Cognition and memory are normal. She expresses impulsivity. She exhibits a depressed mood. She expresses suicidal ideation.    Review of Systems  Constitutional: Positive for chills.  Gastrointestinal: Positive for abdominal pain.  Neurological: Negative.   Psychiatric/Behavioral: Positive for depression, substance abuse and suicidal ideas. The patient has insomnia.   All other systems reviewed and are negative.   Blood pressure 116/75, pulse 92, temperature 98.2 F (36.8 C), temperature source Oral, resp. rate 16, height 5\' 8"  (1.727 m), weight 81.6 kg, last menstrual period 05/11/2018, SpO2 100 %.Body mass index is 27.37 kg/m.  General Appearance: Fairly Groomed  Eye Contact:  Good  Speech:  Clear and Coherent  Volume:  Decreased  Mood:  Anxious and Depressed  Affect:  Flat  Thought Process:  Goal Directed and Descriptions of Associations: Intact  Orientation:  Full (Time, Place, and Person)  Thought Content:  WDL  Suicidal Thoughts:  Yes.  with intent/plan  Homicidal Thoughts:  No  Memory:  Immediate;   Fair Recent;   Fair Remote;   Fair  Judgement:  Impaired  Insight:  Shallow  Psychomotor Activity:  Psychomotor Retardation  Concentration:  Concentration: Fair and Attention Span: Fair  Recall:  Fis[employer]Fair  Fund of Knowledge:  Fair  Language:  Fair  Akathisia:  No  Handed:  Right  AIMS (if indicated):     Assets:  Communication Skills Desire for Improvement Financial Resources/Insurance Housing Intimacy Physical Health Resilience Social Support  ADL's:  Intact  Cognition:  WNL  Sleep:  Number of Hours: 7.3      COGNITIVE FEATURES THAT CONTRIBUTE TO RISK:  None    SUICIDE RISK:  Moderate:  Frequent suicidal ideation with limited intensity, and duration, some specificity in  terms of plans, no associated intent, good self-control, limited dysphoria/symptomatology, some risk factors present, and identifiable protective factors, including available and accessible social support.  PLAN OF CARE: hospital admission, medicationmanagement, substance abuse counseling, discharge planning.  Penny Zimmerman is a 32 year old female with a history of recurrent depression, mood instability and substance abuse admitted for worsening of depression and suicidal ideation in the context of relapse on substances.  #Suicidal ideation -patient is able to contract for safety  #Mood -start Tegretol 200 mg BID for mood stabilization -start Luvox 50 mg nightly  #Opioid withdrawal -symptomatic treatment  #Substance ause -positive for cocaine, opioids and cannabis -declines substance abuse treatment  Labs -lipid panel, TSH, A1C -EKG -pregnancy test is negative  #Disposition -discharge with family -follow up with [facility]  I certify that inpatient services furnished can reasonably be expected to improve the patient's condition.   Kristine Linea, MD 05/20/2018, 1:08 PM

## 2018-05-19 NOTE — ED Notes (Signed)
Patient tearful upon admission to unit.  Patient endorses SI but agrees to contract.  Patient oriented to unit.  No distress noted.

## 2018-05-19 NOTE — ED Provider Notes (Signed)
Patient has been accepted to the inpatient psychiatric service   Penny Zimmerman, Kersten Salmons E, MD 05/19/18 1445

## 2018-05-19 NOTE — Plan of Care (Signed)
New admission.  Problem: Education: Goal: Knowledge of [place] [facility] Education information/materials will improve Outcome: Not Progressing Goal: Emotional status will improve Outcome: Not Progressing Goal: Mental status will improve Outcome: Not Progressing Goal: Verbalization of understanding the information provided will improve Outcome: Not Progressing   Problem: Activity: Goal: Interest or engagement in activities will improve Outcome: Not Progressing Goal: Sleeping patterns will improve Outcome: Not Progressing   Problem: Coping: Goal: Ability to verbalize frustrations and anger appropriately will improve Outcome: Not Progressing Goal: Ability to demonstrate self-control will improve Outcome: Not Progressing   Problem: Activity: Goal: Interest or engagement in leisure activities will improve Outcome: Not Progressing Goal: Imbalance in normal sleep/wake cycle will improve Outcome: Not Progressing   Problem: Health Behavior/Discharge Planning: Goal: Ability to make decisions will improve Outcome: Not Progressing Goal: Compliance with therapeutic regimen will improve Outcome: Not Progressing   Problem: Safety: Goal: Ability to disclose and discuss suicidal ideas will improve Outcome: Not Progressing Goal: Ability to identify and utilize support systems that promote safety will improve Outcome: Not Progressing   Problem: Self-Concept: Goal: Will verbalize positive feelings about self Outcome: Not Progressing Goal: Level of anxiety will decrease Outcome: Not Progressing   Problem: Health Behavior/Discharge Planning: Goal: Ability to identify changes in lifestyle to reduce recurrence of condition will improve Outcome: Not Progressing Goal: Identification of resources available to assist in meeting health care needs will improve Outcome: Not Progressing   Problem: Physical Regulation: Goal: Complications related to the disease process, condition or treatment  will be avoided or minimized Outcome: Not Progressing

## 2018-05-19 NOTE — ED Notes (Signed)
Nurse talked with patient and she states that she does feel hopeless and afraid that she could hurt herself, she does not care what kind of drugs she takes, states " I will take whatever I can get my hands on, " Patient denies Hi/ or avh, states that she has 3 children and her parents and husband are supportive, she talked about her mood swings and how her mom is bi-polar, and sister gets manic and she believes that she is bi-polar also, and wants to receive treatment, q 15 minute checks and camera surveillance in progress for safety.

## 2018-05-19 NOTE — ED Notes (Signed)
Pt dressed in gown and hospital provided underwear.  All jewelry removed from patient and placed in specimen cup, handed off to patients husband at this time.  Pt clothing, shoes, wallet, phone placed in labeled patient belonging bag and removed from patient room at this time.  Jewelry sent with husband:  Rings Earrings Nose ring Bracelets  Belongings secured here:  Jeans Grey long sleeve shirt Tennis shoes Zip up Archivistclutch Phone Charger ID

## 2018-05-19 NOTE — Progress Notes (Signed)
Admission Note:   Report was received from [place], [place] [id number], on a 32 year old female who presents Voluntary in no acute distress for the treatment of Substance abuse and Depression. Patient appears flat and depressed. Patient was calm and cooperative with admission process. Patient denies SI/HI/AVH on admission. Patient endorses depression rating it a "10/10", however, she does not know why she is depressed. Patient rates her anxiety a "10/10" stating "everything I guess". Patient states that she has positive support systems and she is here because she is a "drug addict and struggling with severe depression". While here patient wants to work on "get on medications and to feel better". Patient also wants help with "medicaid". Patient has no significant past medical history. Skin was assessed and found to be clear of any abnormal marks apart from multiple tattoos on her body. Patient searched and no contraband found and unit policies explained and understanding verbalized. Consents obtained. food and fluids offered, and fluids accepted. Patient had no additional questions or concerns.

## 2018-05-19 NOTE — ED Notes (Signed)
TT[facility] with Tor[facility].  Patient could not be accepted at this time due to high amount of patient requests for placement.  Patient was referred to:  Marland Kitchen. [facility] [phone]),   . [facility] [phone] or [phone]),   . [place] [phone] or [phone])  . [facility] [phone]),   . [facility] [phone]),   . [facility] [phone] or [phone])

## 2018-05-19 NOTE — Consult Note (Signed)
[facility] Face-to-Face Psychiatry Consult   Reason for Consult: Consult for this 32 year old woman who came to the emergency room seeking help for symptoms of depression as well as substance abuse Referring Physician: Jimmye Norman Patient Identification: MAKHAYLA MCMURRY MRN:  [id number] Principal Diagnosis: Major depressive disorder, recurrent severe without psychotic features ([place]) Diagnosis:   Patient Active Problem List   Diagnosis Date Noted  . Major depressive disorder, recurrent severe without psychotic features ([place]) [F33.2] 05/19/2018  . Opioid use disorder, moderate, dependence ([place]) [F11.20] 05/19/2018  . Cocaine abuse ([place]) [F14.10] 05/19/2018  . Marijuana smoker, episodic [F12.90] 12/04/2015  . Depression [F32.9] 10/24/2015  . HGSIL (high grade squamous intraepithelial dysplasia) [IMO0002] 06/12/2015    Total Time spent with patient: 1 hour  Subjective:   Ardys B Verge is a 32 y.o. female patient admitted with "I am a drug addict and I know I have mental problems".  HPI: Patient seen chart reviewed.  Patient had been seen by [facility].  Dr. Mamie Nick asked me also to see the patient to make sure she needed hospitalization as beds will not be available probably until later today.  This is a 32 year old woman who came to the emergency room voluntarily seeking treatment for symptoms of depression.  Patient says that for the past 2 years she has felt like her mood has never really been stable.  She says that shortly after or around the time of giving birth to her youngest child she became depressed and that ever since then her mood has been labile and out of control.  Generally feels negative although she will go back and forth as far as energy level.  Concentration and cognition have felt impaired.  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  Her sleep is erratic.  At times she has suicidal thoughts although she has not acted on  it.  Patient relapsed into drug abuse about 6 months after having her youngest child and had been intermittently abusing cocaine and heroin ever since.  She had tried to stay clean for the past 3 months but then relapsed 2 days ago which was the crucial moment when she realized she was not taking care of this on her own.  She is not on any psychiatric medicine not seeing anyone for outpatient mental health treatment.  Social history: Patient lives with her husband has 51 young children at home.  Husband appears to be supportive but it sounds like their relationship is strained related to her behavior.  Medical history: History of having abnormal Pap smear but otherwise no ongoing medical issues not on any prescription medicine.  Substance abuse history: Patient says she developed problems with substance abuse young as an adolescent.  Was in rehab around that time and afterwards managed to stay clean for what she estimates being about 10 years or more.  Relapsed as mentioned above just about a year and a half ago.  Uses cocaine and heroin.  Patient other than being in rehab does not sound like she is ever been in any kind of treatment.  She did try to go to [place] a few months ago and was briefly taking Suboxone but thought that it actually made her feel worse so she stopped it.  Denies alcohol abuse.  She smokes about a pack a day.  Past Psychiatric History: No previous hospitalization other than rehab as an adolescent.  No history of suicide  attempts no history of violence.  She thinks that as an adolescent she may have been prescribed antidepressants but does not remember anything about it.  No medication as an adult.  Patient is concerned that she may have bipolar disorder because her mood gets both depressed and irritable and angry in her energy level and sleep patterns are irregular.  Risk to Self: Suicidal Ideation: Yes-Currently Present Suicidal Intent: No-Not Currently/Within Last 6 Months Is  patient at risk for suicide?: No Suicidal Plan?: Yes-Currently Present Specify Current Suicidal Plan: overdose on purpose, hang herself Access to Means: Yes Specify Access to Suicidal Means: has access to drugs and rope at home What has been your use of drugs/alcohol within the last 12 months?: Use of cocaine and heroin(Husband states "She will take whatever she can get her hands) How many times?: 0 Other Self Harm Risks: denied Triggers for Past Attempts: None known Intentional Self Injurious Behavior: None Risk to Others: Homicidal Ideation: No Thoughts of Harm to Others: No Current Homicidal Intent: No Current Homicidal Plan: No Access to Homicidal Means: No Identified Victim: None identified History of harm to others?: No Assessment of Violence: None Noted Does patient have access to weapons?: No Criminal Charges Pending?: No Does patient have a court date: No Prior Inpatient Therapy: Prior Inpatient Therapy: No Prior Outpatient Therapy: Prior Outpatient Therapy: No Does patient have an ACCT team?: No Does patient have Intensive In-House Services?  : No Does patient have [facility] services? : No Does patient have P4CC services?: No  Past Medical History:  Past Medical History:  Diagnosis Date  . Depression 10/24/2015  . 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  For repeat postpartum.      Past Surgical History:  Procedure Laterality Date  . NO PAST SURGERIES    . TUBAL LIGATION Bilateral 12/14/2015   Procedure: POST PARTUM TUBAL LIGATION;  Surgeon: Rubie Maid, MD;  Location: [facility] ORS;  Service: Gynecology;  Laterality: Bilateral;   Family History:  Family History  Problem Relation Age of Onset  . Diabetes Father   . Hypertension Father   . Hyperlipidemia Father   . Diabetes Paternal Grandmother    Family Psychiatric  History: Her mother has reportedly been diagnosed with bipolar disorder.  She says there is a great  deal of substance abuse in her mother's side of the family.  She does not know of anyone who has killed themselves Social History:  Social History   Substance and Sexual Activity  Alcohol Use No  . Alcohol/week: 0.0 standard drinks     Social History   Substance and Sexual Activity  Drug Use No    Social History   Socioeconomic History  . Marital status: Divorced    Spouse name: Not on file  . Number of children: Not on file  . Years of education: Not on file  . Highest education level: Not on file  Occupational History  . Occupation: Freight forwarder    Comment: [employer]  Social Needs  . Financial resource strain: Not on file  . Food insecurity:    Worry: Not on file    Inability: Not on file  . Transportation needs:    Medical: Not on file    Non-medical: Not on file  Tobacco Use  . Smoking status: Current Some Day Smoker    Packs/day: 0.50    Years: 10.00    Pack years: 5.00  . Smokeless tobacco: Never Used  Substance and Sexual  Activity  . Alcohol use: No    Alcohol/week: 0.0 standard drinks  . Drug use: No  . Sexual activity: Yes    Partners: Male    Birth control/protection: Surgical    Comment: Tubal ligation   Lifestyle  . Physical activity:    Days per week: Not on file    Minutes per session: Not on file  . Stress: Not on file  Relationships  . Social connections:    Talks on phone: Not on file    Gets together: Not on file    Attends religious service: Not on file    Active member of club or organization: Not on file    Attends meetings of clubs or organizations: Not on file    Relationship status: Not on file  Other Topics Concern  . Not on file  Social History Narrative  . Not on file   Additional Social History:    Allergies:  No Known Allergies  Labs:  Results for orders placed or performed during the hospital encounter of 05/18/18 (from the past 48 hour(s))  Chlamydia/NGC rt PCR     Status: None   Collection Time: 05/18/18  9:19 PM  Result  Value Ref Range   Specimen source GC/Chlam ENDOCERVICAL    Chlamydia Tr NOT DETECTED NOT DETECTED   N gonorrhoeae NOT DETECTED NOT DETECTED    Comment: (NOTE) This CT/NG assay has not been evaluated in patients with a history of  hysterectomy. Performed at [facility], [address] [place], [place] [address]   Wet prep, genital     Status: Abnormal   Collection Time: 05/18/18  9:19 PM  Result Value Ref Range   Yeast Wet Prep HPF POC NONE SEEN NONE SEEN   Trich, Wet Prep NONE SEEN NONE SEEN   Clue Cells Wet Prep HPF POC FEW (A) NONE SEEN   WBC, Wet Prep HPF POC MODERATE (A) NONE SEEN   Sperm NONE SEEN     Comment: Performed at [facility], [address] [place], [place] [address]  Comprehensive metabolic panel     Status: Abnormal   Collection Time: 05/18/18  9:34 PM  Result Value Ref Range   Sodium 137 135 - 145 mmol/L   Potassium 3.3 (L) 3.5 - 5.1 mmol/L   Chloride 105 98 - 111 mmol/L   CO2 25 22 - 32 mmol/L   Glucose, Bld 94 70 - 99 mg/dL   BUN 8 6 - 20 mg/dL   Creatinine, Ser 0.56 0.44 - 1.00 mg/dL   Calcium 9.2 8.9 - 10.3 mg/dL   Total Protein 7.2 6.5 - 8.1 g/dL   Albumin 4.5 3.5 - 5.0 g/dL   AST 20 15 - 41 U/L   ALT 14 0 - 44 U/L   Alkaline Phosphatase 60 38 - 126 U/L   Total Bilirubin 0.5 0.3 - 1.2 mg/dL   GFR calc non Af Amer >60 >60 mL/min   GFR calc Af Amer >60 >60 mL/min    Comment: (NOTE) The eGFR has been calculated using the CKD EPI equation. This calculation has not been validated in all clinical situations. eGFR's persistently <60 mL/min signify possible Chronic Kidney Disease.    Anion gap 7 5 - 15    Comment: Performed at [facility], [address] [place], [place] [address]  Ethanol     Status: None   Collection Time: 05/18/18  9:34 PM  Result Value Ref Range   Alcohol, Ethyl (B) <10 <  10 mg/dL    Comment: (NOTE) Lowest detectable limit for serum alcohol is 10 mg/dL. For medical purposes only. Performed at  [facility], [address] [place], [place] [address]   Salicylate level     Status: None   Collection Time: 05/18/18  9:34 PM  Result Value Ref Range   Salicylate Lvl <8.3 2.8 - 30.0 mg/dL    Comment: Performed at [facility], [address] [place], [place] [address]  Acetaminophen level     Status: Abnormal   Collection Time: 05/18/18  9:34 PM  Result Value Ref Range   Acetaminophen (Tylenol), Serum <10 (L) 10 - 30 ug/mL    Comment: (NOTE) Therapeutic concentrations vary significantly. A range of 10-30 ug/mL  may be an effective concentration for many patients. However, some  are best treated at concentrations outside of this range. Acetaminophen concentrations >150 ug/mL at 4 hours after ingestion  and >50 ug/mL at 12 hours after ingestion are often associated with  toxic reactions. Performed at [facility], [address] [place], [place] [address]   CBC with Differential     Status: Abnormal   Collection Time: 05/18/18  9:34 PM  Result Value Ref Range   WBC 7.9 3.6 - 11.0 K/uL   RBC 4.29 3.80 - 5.20 MIL/uL   Hemoglobin 10.8 (L) 12.0 - 16.0 g/dL   HCT 32.2 (L) 35.0 - 47.0 %   MCV 75.1 (L) 80.0 - 100.0 fL   MCH 25.1 (L) 26.0 - 34.0 pg   MCHC 33.4 32.0 - 36.0 g/dL   RDW 17.6 (H) 11.5 - 14.5 %   Platelets 243 150 - 440 K/uL   Neutrophils Relative % 48 %   Neutro Abs 3.8 1.4 - 6.5 K/uL   Lymphocytes Relative 39 %   Lymphs Abs 3.0 1.0 - 3.6 K/uL   Monocytes Relative 8 %   Monocytes Absolute 0.6 0.2 - 0.9 K/uL   Eosinophils Relative 5 %   Eosinophils Absolute 0.4 0 - 0.7 K/uL   Basophils Relative 0 %   Basophils Absolute 0.0 0 - 0.1 K/uL    Comment: Performed at [facility], [address], [place],  [address]    Current Facility-Administered Medications  Medication Dose Route Frequency Provider Last Rate Last Dose  . nicotine (NICODERM CQ - dosed in mg/24 hours) patch 21 mg  21 mg Transdermal Daily Kayo Zion, Madie Reno, MD       Current Outpatient Medications  Medication Sig Dispense Refill  . doxycycline (VIBRAMYCIN) 50 MG capsule Take 2 capsules (100 mg total) by mouth 2 (two) times daily. (Patient not taking: Reported on 05/19/2018) 56 capsule 0  . ibuprofen (ADVIL,MOTRIN) 600 MG tablet Take 1 tablet (600 mg total) by mouth every 8 (eight) hours as needed. (Patient not taking: Reported on 05/19/2018) 20 tablet 0  . metroNIDAZOLE (FLAGYL) 500 MG tablet Take 1 tablet (500 mg total) by mouth 2 (two) times daily. (Patient not taking: Reported on 05/19/2018) 28 tablet 0    Musculoskeletal: Strength & Muscle Tone: within normal limits Gait & Station: normal Patient leans: N/A  Psychiatric Specialty Exam: Physical Exam  Nursing note and vitals reviewed. Constitutional: She appears well-developed and well-nourished.  HENT:  Head: Normocephalic and atraumatic.  Eyes: Pupils are equal, round, and reactive to light. Conjunctivae are normal.  Neck: Normal range of motion.  Cardiovascular: Regular rhythm and normal heart sounds.  Respiratory: Effort normal. No respiratory distress.  GI: Soft.  Musculoskeletal:  Normal range of motion.  Neurological: She is alert.  Skin: Skin is warm and dry.  Psychiatric: Her affect is blunt. Her speech is delayed. She is slowed. Cognition and memory are normal. She expresses impulsivity. She exhibits a depressed mood. She expresses suicidal ideation. She expresses no suicidal plans.    Review of Systems  Constitutional: Negative.   HENT: Negative.   Eyes: Negative.   Respiratory: Negative.   Cardiovascular: Negative.   Gastrointestinal: Negative.   Musculoskeletal: Negative.   Skin: Negative.   Neurological: Negative.   Psychiatric/Behavioral: Positive for depression, substance abuse and suicidal ideas. Negative for hallucinations and memory loss. The patient is nervous/anxious and has insomnia.     Blood pressure 130/77, pulse (!) 57, temperature 98 F (36.7 C),  temperature source Oral, resp. rate 18, height _0  (1.727 m), weight 82.6 kg, last menstrual period 05/11/2018, SpO2 100 %.Body mass index is 27.67 kg/m.  General Appearance: Disheveled  Eye Contact:  Fair  Speech:  Slow  Volume:  Decreased  Mood:  Depressed and Dysphoric  Affect:  Constricted  Thought Process:  Goal Directed  Orientation:  Full (Time, Place, and Person)  Thought Content:  Logical  Suicidal Thoughts:  Yes.  without intent/plan  Homicidal Thoughts:  No  Memory:  Immediate;   Fair Recent;   Fair Remote;   Fair  Judgement:  Fair  Insight:  Fair  Psychomotor Activity:  Decreased  Concentration:  Concentration: Fair  Recall:  [employer] of Knowledge:  Fair  Language:  Fair  Akathisia:  No  Handed:  Right  AIMS (if indicated):     Assets:  Desire for Improvement Housing Physical Health Resilience Social Support  ADL's:  Intact  Cognition:  WNL  Sleep:        Treatment Plan Summary: Daily contact with patient to assess and evaluate symptoms and progress in treatment, Medication management and Plan Patient with substance abuse and mood disorder.  Functioning very poorly.  Recent suicidal ideation.  Not receiving any outpatient treatment.  Feeling overwhelmed and at her wit's end.  Patient does meet criteria for inpatient hospitalization.  Because she had been clean for a couple months and then only relapsed a couple days ago I do not think she needs specific medicine for opiate withdrawal.  Medications at this point will just be PRN for mild pain tobacco use and sleep if needed.  Primary treatment team can discuss medications for mood disorder.  Orders were done by Dr. Mamie Nick for admission downstairs.  Case reviewed with emergency room doctor and nursing.  Agree with overall plan.  Patient informed psychoeducation done she is agreeable as well.  Disposition: Recommend psychiatric Inpatient admission when medically cleared. Supportive therapy provided about ongoing  stressors.  Alethia Berthold, MD 05/19/2018 1:15 PM

## 2018-05-19 NOTE — ED Notes (Signed)
Patient complained of headache,ask for tylenol , Nurse will obtain. Patient without allergies.

## 2018-05-19 NOTE — Tx Team (Signed)
Initial Treatment Plan 05/19/2018 7:26 PM Penny Zimmerman [id number]    PATIENT STRESSORS: Health problems Substance abuse   PATIENT STRENGTHS: Ability for insight Communication skills General fund of knowledge Motivation for treatment/growth Supportive family/friends   PATIENT IDENTIFIED PROBLEMS: Severe Depression  Physical health issues  Substance abuse                 DISCHARGE CRITERIA:  Ability to meet basic life and health needs Improved stabilization in mood, thinking, and/or behavior Motivation to continue treatment in a less acute level of care Reduction of life-threatening or endangering symptoms to within safe limits  PRELIMINARY DISCHARGE PLAN: Outpatient therapy Return to previous living arrangement  PATIENT/FAMILY INVOLVEMENT: This treatment plan has been presented to and reviewed with the patient, [name]. The patient has been given the opportunity to ask questions and make suggestions.  Carston Riedl, RN 05/19/2018, 7:26 PM

## 2018-05-19 NOTE — ED Notes (Signed)
Dr. Toni Amendlapacs is talking to Patient, no behavioral issues, Patient remains calm and cooperative.

## 2018-05-19 NOTE — ED Notes (Signed)
[facility], patient is going to room 323.

## 2018-05-19 NOTE — ED Notes (Addendum)
Patient with discharge/readmission orders to go to BMU, all belongings taken with patient , and she was transferred in w/c with nurse. Patient is voluntary.

## 2018-05-19 NOTE — ED Notes (Signed)
Patient's husband is visiting , supervised visit is calm and cooperative.

## 2018-05-20 DIAGNOSIS — Z5181 Encounter for therapeutic drug level monitoring: Secondary | ICD-10-CM

## 2018-05-20 DIAGNOSIS — F314 Bipolar disorder, current episode depressed, severe, without psychotic features: Principal | ICD-10-CM

## 2018-05-20 LAB — LIPID PANEL
CHOLESTEROL: 139 mg/dL (ref 0–200)
HDL: 41 mg/dL (ref >40)
LDL Cholesterol: 79 mg/dL (ref 0–99)
TRIGLYCERIDES: 97 mg/dL (ref <150)
Total CHOL/HDL Ratio: 3.4 RATIO
VLDL: 19 mg/dL (ref 0–40)

## 2018-05-20 LAB — HEMOGLOBIN A1C
Hgb A1c MFr Bld: 4.9 % (ref 4.8–5.6)
Mean Plasma Glucose: 93.93 mg/dL

## 2018-05-20 LAB — HCG, QUANTITATIVE, PREGNANCY: hCG, Beta Chain, Quant, S: <1 m[IU]/mL (ref <5)

## 2018-05-20 LAB — TSH: TSH: 1.168 u[IU]/mL (ref 0.350–4.500)

## 2018-05-20 MED ORDER — CARBAMAZEPINE 200 MG PO TABS
200.0000 mg | ORAL_TABLET | Freq: Two times a day (BID) | ORAL | Status: DC
  Administered 2018-05-20 – 2018-05-24 (×8): 200 mg via ORAL
  Filled 2018-05-20 (×22): qty 1

## 2018-05-20 MED ORDER — ONDANSETRON HCL 4 MG PO TABS
4.0000 mg | ORAL_TABLET | Freq: Three times a day (TID) | ORAL | Status: DC | PRN
  Filled 2018-05-20: qty 1

## 2018-05-20 MED ORDER — DICYCLOMINE HCL 20 MG PO TABS
20.0000 mg | ORAL_TABLET | Freq: Three times a day (TID) | ORAL | Status: DC
  Administered 2018-05-20 – 2018-05-23 (×10): 20 mg via ORAL
  Filled 2018-05-20 (×12): qty 1

## 2018-05-20 MED ORDER — LOPERAMIDE HCL 2 MG PO CAPS
4.0000 mg | ORAL_CAPSULE | ORAL | Status: DC | PRN
  Administered 2018-05-20: 4 mg via ORAL
  Filled 2018-05-20: qty 2

## 2018-05-20 MED ORDER — IBUPROFEN 600 MG PO TABS
600.0000 mg | ORAL_TABLET | Freq: Four times a day (QID) | ORAL | Status: DC | PRN
  Administered 2018-05-20: 600 mg via ORAL
  Filled 2018-05-20: qty 1

## 2018-05-20 MED ORDER — FLUVOXAMINE MALEATE 50 MG PO TABS
50.0000 mg | ORAL_TABLET | Freq: Every day | ORAL | Status: DC
  Administered 2018-05-20: 50 mg via ORAL
  Filled 2018-05-20: qty 1

## 2018-05-20 MED ORDER — CLONIDINE HCL 0.1 MG PO TABS
0.1000 mg | ORAL_TABLET | Freq: Three times a day (TID) | ORAL | Status: DC
  Administered 2018-05-20 – 2018-05-23 (×5): 0.1 mg via ORAL
  Filled 2018-05-20 (×9): qty 1
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
## 2018-05-20 NOTE — H&P (Signed)
Psychiatric Admission Assessment Adult  Patient Identification: Penny Zimmerman MRN:  [id number] Date of Evaluation:  05/20/2018 Chief Complaint:  Depression Principal Diagnosis: Bipolar I disorder, most recent episode depressed, severe without psychotic features (HCC) Diagnosis:   Patient Active Problem List   Diagnosis Date Noted  . Bipolar I disorder, most recent episode depressed, severe without psychotic features (HCC) [F31.4] 05/19/2018    Priority: High  . Opioid use disorder, moderate, dependence (HCC) [F11.20] 05/19/2018  . Cocaine use disorder, moderate, dependence (HCC) [F14.20] 05/19/2018  . Tobacco use disorder [F17.200] 05/19/2018  . Cannabis use disorder, moderate, dependence (HCC) [F12.20] 12/04/2015  . Depression [F32.9] 10/24/2015  . 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 Ms. Beane is a 32 year old female with a history of bipolar disorder.  Chief complaint. "I came for help."  History of present illness. Information was obtained from the patient and the chart.  The patient came to the ER complaining of severe depression and suicidal ideation with a plan to overdose on drugs. She has been increasingly depressed for several days, unable to get out of bed, missing work and neglecting her family. This has happened before bur she never seeks treatment. When she developed thoughts of hurting herself for the first time ever she became frightened and came to the ER. She re;orts excessive sleeping, anhedonia, feeling of guilt hopelessness, helplessness, poor energy and concentration, social isolation, crying spells and heightened anxiety. She denies psychotic symptoms. Ne panic attacks but severe social anxiety always exacerbated by depression, PTSD with nightmares and flashbacks from past abuse and OCD. She had been sober for 12 years up until recently when she relapse on cocaine and heroin. Also  positive for cannabis. Last use was two days ago and she started experiencing withdrawal with chills and abdominal cramping.  Past psychiatric history. Long history of mood instability with periods of expansive mood, insomnia, hyperactivity. Never treated, never hospitalized. No suicide attempts. She was in rehab when teenager and stayed sober for extended period of time. Tried Suboxone at [facility] but did not like it. Felt she was on heroine.  Family psychiatric history. Mother and sister with bipolar.  Social history. Lives with her husband and three children ages 31, 72 and 61. 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   Total Time spent with patient: 1 hour  Is the patient at risk to self? Yes.    Has the patient been a risk to self in the past 6 months? No.  Has the patient been a risk to self within the distant past? No.  Is the patient a risk to others? No.  Has the patient been a risk to others in the past 6 months? No.  Has the patient been a risk to others within the distant past? No.   Prior Inpatient Therapy:   Prior Outpatient Therapy:    Alcohol Screening: 1. How often do you have a drink containing alcohol?: Monthly or less 2. How many drinks containing alcohol do you have on a typical day when you are drinking?: 1 or 2 3. How often do you have six or more drinks on one occasion?: Less than monthly AUDIT-C Score: 2 4. How often during the last year have you found that you were not able to stop drinking once you had started?: Never 5. How often during the last year have you failed to do what was normally expected from you becasue of  drinking?: Never 6. How often during the last year have you needed a first drink in the morning to get yourself going after a heavy drinking session?: Never 7. How often during the last year have you had a feeling of guilt of remorse after drinking?: Never 8. How often during the last year have you been unable to remember what happened the night  before because you had been drinking?: Never 9. Have you or someone else been injured as a result of your drinking?: No 10. Has a relative or friend or a doctor or another health worker been concerned about your drinking or suggested you cut down?: No Alcohol Use Disorder Identification Test Final Score (AUDIT): 2 Intervention/Follow-up: AUDIT Score <7 follow-up not indicated Substance Abuse History in the last 12 months:  Yes.   Consequences of Substance Abuse: Negative Previous Psychotropic Medications: No  Psychological Evaluations: No  Past Medical History:  Past Medical History:  Diagnosis Date  . Depression 10/24/2015  . 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  For repeat postpartum.      Past Surgical History:  Procedure Laterality Date  . NO PAST SURGERIES    . TUBAL LIGATION Bilateral 12/14/2015   Procedure: POST PARTUM TUBAL LIGATION;  Surgeon: Hildred LaserAnika Cherry, MD;  Location: [facility] ORS;  Service: Gynecology;  Laterality: Bilateral;   Family History:  Family History  Problem Relation Age of Onset  . Diabetes Father   . Hypertension Father   . Hyperlipidemia Father   . Diabetes Paternal Grandmother    Tobacco Screening: Have you used any form of tobacco in the last 30 days? (Cigarettes, Smokeless Tobacco, Cigars, and/or Pipes): Yes Tobacco use, Select all that apply: 5 or more cigarettes per day Are you interested in Tobacco Cessation Medications?: Yes, will notify MD for an order Counseled patient on smoking cessation including recognizing danger situations, developing coping skills and basic information about quitting provided: Yes Social History:  Social History   Substance and Sexual Activity  Alcohol Use No  . Alcohol/week: 0.0 standard drinks     Social History   Substance and Sexual Activity  Drug Use No    Additional Social History:      History of alcohol / drug use?: Yes                     Allergies:  No Known Allergies Lab Results:  Results for orders placed or performed during the hospital encounter of 05/19/18 (from the past 48 hour(s))  Lipid panel     Status: None   Collection Time: 05/20/18  6:29 AM  Result Value Ref Range   Cholesterol 139 0 - 200 mg/dL   Triglycerides 97 <161<150 mg/dL   HDL 41 >09>40 mg/dL   Total CHOL/HDL Ratio 3.4 RATIO   VLDL 19 0 - 40 mg/dL   LDL Cholesterol 79 0 - 99 mg/dL    Comment:        Total Cholesterol/HDL:CHD Risk Coronary Heart Disease Risk Table                     Men   Women  1/2 Average Risk   3.4   3.3  Average Risk       5.0   4.4  2 X Average Risk   9.6   7.1  3 X Average Risk  23.4   11.0        Use the calculated Patient Ratio  above and the CHD Risk Table to determine the patient's CHD Risk.        ATP III CLASSIFICATION (LDL):  <100     mg/dL   Optimal  161-096  mg/dL   Near or Above                    Optimal  130-159  mg/dL   Borderline  045-409  mg/dL   High  >811     mg/dL   Very High Performed at [facility], [address] [place], [place] [address]   TSH     Status: None   Collection Time: 05/20/18  6:29 AM  Result Value Ref Range   TSH 1.168 0.350 - 4.500 uIU/mL    Comment: Performed by a 3rd Generation assay with a functional sensitivity of <=0.01 uIU/mL. Performed at [facility], [address] [place], [place] [address]   Hemoglobin A1c     Status: None   Collection Time: 05/20/18  6:29 AM  Result Value Ref Range   Hgb A1c MFr Bld 4.9 4.8 - 5.6 %    Comment: (NOTE) Pre diabetes:          5.7%-6.4% Diabetes:              >6.4% Glycemic control for   <7.0% adults with diabetes    Mean Plasma Glucose 93.93 mg/dL    Comment: Performed at [facility], [address] [place], [place] [address]  hCG, quantitative, pregnancy     Status: None   Collection Time: 05/20/18  6:29 AM  Result Value Ref Range   hCG, Beta Chain, Quant, S <1 <5 mIU/mL    Comment:           GEST. AGE      CONC.  (mIU/mL)   <=1 WEEK        5 - 50     2 WEEKS       50 - 500     3 WEEKS       100 - 10,000     4 WEEKS     1,000 - 30,000     5 WEEKS     3,500 - 115,000   6-8 WEEKS     12,000 - 270,000    12 WEEKS     15,000 - 220,000        FEMALE AND NON-PREGNANT FEMALE:     LESS THAN 5 mIU/mL Performed at [facility], [address] [place], [place] [address]     Blood Alcohol level:  Lab Results  Component Value Date   [facility] <10 05/18/2018    Metabolic Disorder Labs:  Lab Results  Component Value Date   HGBA1C 4.9 05/20/2018   MPG 93.93 05/20/2018   No results found for: PROLACTIN Lab Results  Component Value Date   CHOL 139 05/20/2018   TRIG 97 05/20/2018   HDL 41 05/20/2018   CHOLHDL 3.4 05/20/2018   VLDL 19 05/20/2018   LDLCALC 79 05/20/2018    Current Medications: Current Facility-Administered Medications  Medication Dose Route Frequency Provider Last Rate Last Dose  . acetaminophen (TYLENOL) tablet 650 mg  650 mg Oral Q6H PRN Tawyna Pellot B, MD      . alum & mag hydroxide-simeth (MAALOX/MYLANTA) 200-200-20 MG/5ML suspension 30 mL  30 mL Oral Q4H PRN Lilian Fuhs B, MD      . carbamazepine (TEGRETOL) tablet 200 mg  200 mg Oral  BID AC & HS Katlyne Nishida B, MD      . cloNIDine (CATAPRES) tablet 0.1 mg  0.1 mg Oral TID Nevin Grizzle B, MD      . dicyclomine (BENTYL) tablet 20 mg  20 mg Oral TID AC Nasire Reali B, MD      . fluvoxaMINE (LUVOX) tablet 50 mg  50 mg Oral QHS Danea Manter B, MD      . hydrOXYzine (ATARAX/VISTARIL) tablet 50 mg  50 mg Oral TID PRN Uriyah Raska B, MD   50 mg at 05/20/18 0821  . ibuprofen (ADVIL,MOTRIN) tablet 600 mg  600 mg Oral Q6H PRN Azeez Dunker B, MD      . loperamide (IMODIUM) capsule 4 mg  4 mg Oral PRN Sanav Remer B, MD      . magnesium hydroxide (MILK OF MAGNESIA) suspension 30 mL  30 mL Oral Daily PRN Tripp Goins B, MD      . nicotine  (NICODERM CQ - dosed in mg/24 hours) patch 21 mg  21 mg Transdermal Daily Sindee Stucker B, MD   21 mg at 05/20/18 0820  . ondansetron (ZOFRAN) tablet 4 mg  4 mg Oral Q8H PRN Tamorah Hada B, MD      . traZODone (DESYREL) tablet 100 mg  100 mg Oral QHS PRN Emet Rafanan B, MD   100 mg at 05/19/18 2131   PTA Medications: Medications Prior to Admission  Medication Sig Dispense Refill Last Dose  . doxycycline (VIBRAMYCIN) 50 MG capsule Take 2 capsules (100 mg total) by mouth 2 (two) times daily. (Patient not taking: Reported on 05/19/2018) 56 capsule 0 Completed Course at Unknown time  . ibuprofen (ADVIL,MOTRIN) 600 MG tablet Take 1 tablet (600 mg total) by mouth every 8 (eight) hours as needed. (Patient not taking: Reported on 05/19/2018) 20 tablet 0 Completed Course at Unknown time  . metroNIDAZOLE (FLAGYL) 500 MG tablet Take 1 tablet (500 mg total) by mouth 2 (two) times daily. (Patient not taking: Reported on 05/19/2018) 28 tablet 0 Completed Course at Unknown time    Musculoskeletal: Strength & Muscle Tone: within normal limits Gait & Station: normal Patient leans: N/A  Psychiatric Specialty Exam: Physical Exam  Nursing note and vitals reviewed. Constitutional: She is oriented to person, place, and time. She appears well-developed and well-nourished.  HENT:  Head: Normocephalic and atraumatic.  Eyes: Pupils are equal, round, and reactive to light. Conjunctivae and EOM are normal.  Neck: Normal range of motion. Neck supple.  Cardiovascular: Normal rate, regular rhythm and normal heart sounds.  Respiratory: Effort normal and breath sounds normal.  GI: Soft. Bowel sounds are normal.  Musculoskeletal: Normal range of motion.  Neurological: She is alert and oriented to person, place, and time.  Skin: Skin is warm and dry.  Psychiatric: Her speech is normal. Her affect is blunt. She is slowed and withdrawn. Cognition and memory are normal. She expresses impulsivity. She  exhibits a depressed mood. She expresses suicidal ideation. She expresses suicidal plans.    Review of Systems  Constitutional: Positive for chills.  Gastrointestinal: Positive for abdominal pain.  Neurological: Negative.   Psychiatric/Behavioral: Positive for depression, substance abuse and suicidal ideas. The patient has insomnia.     Blood pressure 116/75, pulse 92, temperature 98.2 F (36.8 C), temperature source Oral, resp. rate 16, height 5\' 8"  (1.727 m), weight 81.6 kg, last menstrual period 05/11/2018, SpO2 100 %.Body mass index is 27.37 kg/m.  See SRA  Sleep:  Number of Hours: 7.3    Treatment Plan Summary: Daily contact with patient to assess and evaluate symptoms and progress in treatment and Medication management   Ms. Ostrand is a 32 year old female with a history of recurrent depression, mood instability and substance abuse admitted for worsening of depression and suicidal ideation in the context of relapse on substances.  #Suicidal ideation -patient is able to contract for safety  #Mood -start Tegretol 200 mg BID for mood stabilization -start Luvox 50 mg nightly -continue Trazodone 100 mg nightly PRN  #Opioid withdrawal -symptomatic treatment  #Substance ause -positive for cocaine, opioids and cannabis -declines substance abuse treatment  Labs -lipid panel, TSH, A1C -EKG -pregnancy test is negative  #Disposition -discharge with family -follow up with [facility]   Observation Level/Precautions:  15 minute checks  Laboratory:  CBC Chemistry Profile UDS UA  Psychotherapy:    Medications:    Consultations:    Discharge Concerns:    Estimated LOS:  Other:     Physician Treatment Plan for Primary Diagnosis: Bipolar I disorder, most recent episode depressed, severe without psychotic features (HCC) Long Term Goal(s): Improvement in symptoms so as ready for discharge  Short Term Goals:  Ability to identify changes in lifestyle to reduce recurrence of condition will improve, Ability to verbalize feelings will improve, Ability to disclose and discuss suicidal ideas, Ability to demonstrate self-control will improve, Ability to identify and develop effective coping behaviors will improve, Ability to maintain clinical measurements within normal limits will improve, Compliance with prescribed medications will improve and Ability to identify triggers associated with substance abuse/mental health issues will improve  Physician Treatment Plan for Secondary Diagnosis: Principal Problem:   Bipolar I disorder, most recent episode depressed, severe without psychotic features (HCC) Active Problems:   Cannabis use disorder, moderate, dependence (HCC)   Opioid use disorder, moderate, dependence (HCC)   Cocaine use disorder, moderate, dependence (HCC)   Tobacco use disorder  Long Term Goal(s): Improvement in symptoms so as ready for discharge  Short Term Goals: Ability to identify changes in lifestyle to reduce recurrence of condition will improve, Ability to demonstrate self-control will improve and Ability to identify triggers associated with substance abuse/mental health issues will improve  I certify that inpatient services furnished can reasonably be expected to improve the patient's condition.    Kristine Linea, MD 8/29/20191:12 PM

## 2018-05-20 NOTE — Progress Notes (Addendum)
[facility] MD Progress Note  05/21/2018 11:39 AM Penny Zimmerman  MRN:  [id number]  Subjective:   Penny Zimmerman is a 32 year old female with a history of depression, anxiety, mood instability and substance abuse admitted for suicidal ideation. She was started on Tegretol and Luvox. Opioid withdrawal is treated symptomatically.  Penny Zimmerman met with treatment team today. She feels better today but still affect is flat and there is psychomotor retardation. No more symptoms of opioid withdrawal. Tolerates Luvox well. Still declines substance abuse treatment.   Principal Problem: Bipolar I disorder, most recent episode depressed, severe without psychotic features ([place]) Diagnosis:   Patient Active Problem List   Diagnosis Date Noted  . Bipolar I disorder, most recent episode depressed, severe without psychotic features ([place]) [F31.4] 05/19/2018    Priority: High  . OCD (obsessive compulsive disorder) [F42.9] 05/21/2018  . PTSD (post-traumatic stress disorder) [F43.10] 05/21/2018  . Opioid use disorder, moderate, dependence ([place]) [F11.20] 05/19/2018  . Cocaine use disorder, moderate, dependence ([place]) [F14.20] 05/19/2018  . Tobacco use disorder [F17.200] 05/19/2018  . Cannabis use disorder, moderate, dependence ([place]) [F12.20] 12/04/2015  . Depression [F32.9] 10/24/2015  . 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 Depression 10/24/2015  . 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  For repeat postpartum.      Past Surgical History:  Procedure Laterality Date  . NO PAST SURGERIES    . TUBAL LIGATION Bilateral 12/14/2015   Procedure: POST PARTUM TUBAL LIGATION;  Surgeon: Rubie Maid, MD;  Location: [facility] ORS;  Service: Gynecology;  Laterality: Bilateral;   Family  History:  Family History  Problem Relation Age of Onset  . Diabetes Father   . Hypertension Father   . Hyperlipidemia Father   . Diabetes Paternal Grandmother    Family Psychiatric  History: mothet and sister with bipolar. Social History:  Social History   Substance and Sexual Activity  Alcohol Use No  . Alcohol/week: 0.0 standard drinks     Social History   Substance and Sexual Activity  Drug Use No    Social History   Socioeconomic History  . Marital status: Divorced    Spouse name: Not on file  . Number of children: Not on file  . Years of education: Not on file  . Highest education level: Not on file  Occupational History  . Occupation: Freight forwarder    Comment: [employer]  Social Needs  . Financial resource strain: Not on file  . Food insecurity:    Worry: Not on file    Inability: Not on file  . Transportation needs:    Medical: Not on file    Non-medical: Not on file  Tobacco Use  . Smoking status: Current Some Day Smoker    Packs/day: 0.50    Years: 10.00    Pack years: 5.00  . Smokeless tobacco: Never Used  Substance and Sexual Activity  . Alcohol use: No    Alcohol/week: 0.0 standard drinks  . Drug use: No  . Sexual activity: Yes    Partners: Male    Birth control/protection: Surgical    Comment: Tubal ligation   Lifestyle  . Physical activity:    Days per week: Not on file    Minutes per session: Not on file  . Stress: Not on file  Relationships  .  Social connections:    Talks on phone: Not on file    Gets together: Not on file    Attends religious service: Not on file    Active member of club or organization: Not on file    Attends meetings of clubs or organizations: Not on file    Relationship status: Not on file  Other Topics Concern  . Not on file  Social History Narrative  . Not on file   Additional Social History:    History of alcohol / drug use?: Yes                    Sleep: Fair  Appetite:  Fair  Current  Medications: Current Facility-Administered Medications  Medication Dose Route Frequency Provider Last Rate Last Dose  . acetaminophen (TYLENOL) tablet 650 mg  650 mg Oral Q6H PRN Demon Volante B, MD      . alum & mag hydroxide-simeth (MAALOX/MYLANTA) 200-200-20 MG/5ML suspension 30 mL  30 mL Oral Q4H PRN Cabrina Shiroma B, MD      . carbamazepine (TEGRETOL) tablet 200 mg  200 mg Oral BID AC & HS Demondre Aguas B, MD   200 mg at 05/21/18 0834  . cloNIDine (CATAPRES) tablet 0.1 mg  0.1 mg Oral TID Avah Bashor B, MD   0.1 mg at 05/20/18 1708  . dicyclomine (BENTYL) tablet 20 mg  20 mg Oral TID AC Locklan Canoy B, MD   20 mg at 05/21/18 0834  . fluvoxaMINE (LUVOX) tablet 100 mg  100 mg Oral QHS Haydin Dunn B, MD      . hydrOXYzine (ATARAX/VISTARIL) tablet 50 mg  50 mg Oral TID PRN Coralynn Gaona B, MD   50 mg at 05/20/18 0821  . ibuprofen (ADVIL,MOTRIN) tablet 600 mg  600 mg Oral Q6H PRN Karolyne Timmons B, MD   600 mg at 05/20/18 1413  . loperamide (IMODIUM) capsule 4 mg  4 mg Oral PRN Antoino Westhoff B, MD   4 mg at 05/20/18 1413  . magnesium hydroxide (MILK OF MAGNESIA) suspension 30 mL  30 mL Oral Daily PRN Zeriah Baysinger B, MD      . nicotine (NICODERM CQ - dosed in mg/24 hours) patch 21 mg  21 mg Transdermal Daily Blayre Papania B, MD   21 mg at 05/21/18 0834  . ondansetron (ZOFRAN) tablet 4 mg  4 mg Oral Q8H PRN Brit Wernette B, MD      . traZODone (DESYREL) tablet 100 mg  100 mg Oral QHS PRN Isobelle Tuckett B, MD   100 mg at 05/20/18 2153    Lab Results:  Results for orders placed or performed during the hospital encounter of 05/19/18 (from the past 48 hour(s))  Lipid panel     Status: None   Collection Time: 05/20/18  6:29 AM  Result Value Ref Range   Cholesterol 139 0 - 200 mg/dL   Triglycerides 97 <150 mg/dL   HDL 41 >40 mg/dL   Total CHOL/HDL Ratio 3.4 RATIO   VLDL 19 0 - 40 mg/dL   LDL Cholesterol 79 0 - 99  mg/dL    Comment:        Total Cholesterol/HDL:CHD Risk Coronary Heart Disease Risk Table                     Men   Women  1/2 Average Risk   3.4   3.3  Average Risk       5.0   4.4  2 X Average Risk   9.6   7.1  3 X Average Risk  23.4   11.0        Use the calculated Patient Ratio above and the CHD Risk Table to determine the patient's CHD Risk.        ATP III CLASSIFICATION (LDL):  <100     mg/dL   Optimal  100-129  mg/dL   Near or Above                    Optimal  130-159  mg/dL   Borderline  160-189  mg/dL   High  >190     mg/dL   Very High Performed at [facility] [address], [address] [place], [place] [address]   TSH     Status: None   Collection Time: 05/20/18  6:29 AM  Result Value Ref Range   TSH 1.168 0.350 - 4.500 uIU/mL    Comment: Performed by a 3rd Generation assay with a functional sensitivity of <=0.01 uIU/mL. Performed at [facility], [address] [place], [place] [address]   Hemoglobin A1c     Status: None   Collection Time: 05/20/18  6:29 AM  Result Value Ref Range   Hgb A1c MFr Bld 4.9 4.8 - 5.6 %    Comment: (NOTE) Pre diabetes:          5.7%-6.4% Diabetes:              >6.4% Glycemic control for   <7.0% adults with diabetes    Mean Plasma Glucose 93.93 mg/dL    Comment: Performed at [facility] [address], [place], [place] [address]  hCG, quantitative, pregnancy     Status: None   Collection Time: 05/20/18  6:29 AM  Result Value Ref Range   hCG, Beta Chain, Quant, S <1 <5 mIU/mL    Comment:          GEST. AGE      CONC.  (mIU/mL)   <=1 WEEK        5 - 50     2 WEEKS       50 - 500     3 WEEKS       100 - 10,000     4 WEEKS     1,000 - 30,000     5 WEEKS     3,500 - 115,000   6-8 WEEKS     12,000 - 270,000    12 WEEKS     15,000 - 220,000        FEMALE AND NON-PREGNANT FEMALE:     LESS THAN 5 mIU/mL Performed at [facility], [address] [place],  [address]     Blood Alcohol level:   Lab Results  Component Value Date   [facility] <10 65/46/5035    Metabolic Disorder Labs: Lab Results  Component Value Date   HGBA1C 4.9 05/20/2018   MPG 93.93 05/20/2018   No results found for: PROLACTIN Lab Results  Component Value Date   CHOL 139 05/20/2018   TRIG 97 05/20/2018   HDL 41 05/20/2018   CHOLHDL 3.4 05/20/2018   VLDL 19 05/20/2018   LDLCALC 79 05/20/2018    Physical Findings: AIMS: Facial and Oral Movements Muscles of Facial Expression: None, normal Lips and Perioral Area: None, normal Jaw: None, normal Tongue: None, normal,Extremity Movements Upper (arms, wrists, hands, fingers): None, normal Lower (legs, knees, ankles, toes): None, normal, Trunk Movements  Neck, shoulders, hips: None, normal, Overall Severity Severity of abnormal movements (highest score from questions above): None, normal Incapacitation due to abnormal movements: None, normal Patient's awareness of abnormal movements (rate only patient's report): No Awareness, Dental Status Current problems with teeth and/or dentures?: No Does patient usually wear dentures?: No  CIWA:  CIWA-Ar Total: 0 COWS:  COWS Total Score: 4  Musculoskeletal: Strength & Muscle Tone: within normal limits Gait & Station: normal Patient leans: N/A  Psychiatric Specialty Exam: Physical Exam  Nursing note and vitals reviewed. Psychiatric: Her speech is normal. Her affect is blunt. She is slowed and withdrawn. Cognition and memory are normal. She expresses impulsivity. She exhibits a depressed mood. She expresses suicidal ideation.    Review of Systems  Constitutional: Positive for chills.  Gastrointestinal: Positive for abdominal pain.  Neurological: Negative.   Psychiatric/Behavioral: Positive for depression, substance abuse and suicidal ideas.  All other systems reviewed and are negative.   Blood pressure 99/65, pulse (!) 53, temperature 97.8 F (36.6 C), temperature source Oral, resp. rate 18, height '5\' 8"'$  (1.727  m), weight 81.6 kg, last menstrual period 05/11/2018, SpO2 100 %.Body mass index is 27.37 kg/m.  General Appearance: Casual  Eye Contact:  Good  Speech:  Clear and Coherent  Volume:  Normal  Mood:  Depressed  Affect:  Flat  Thought Process:  Goal Directed and Descriptions of Associations: Intact  Orientation:  Full (Time, Place, and Person)  Thought Content:  WDL  Suicidal Thoughts:  Yes.  without intent/plan  Homicidal Thoughts:  No  Memory:  Immediate;   Fair Recent;   Fair Remote;   Fair  Judgement:  Poor  Insight:  Lacking  Psychomotor Activity:  Psychomotor Retardation  Concentration:  Concentration: Fair and Attention Span: Fair  Recall:  [employer] of Knowledge:  Fair  Language:  Fair  Akathisia:  No  Handed:  Right  AIMS (if indicated):     Assets:  Communication Skills Desire for Improvement Housing Physical Health Resilience Social Support  ADL's:  Intact  Cognition:  WNL  Sleep:  Number of Hours: 7.5     Treatment Plan Summary: Daily contact with patient to assess and evaluate symptoms and progress in treatment and Medication management   Penny Zimmerman is a 32 year old female with a history of recurrent depression, mood instability and substance abuse admitted for worsening of depression and suicidal ideation in the context of relapse on substances.  #Suicidal ideation, resolved -patient is able to contract for safety  #Mood -continue Tegretol 200 mg BID for mood stabilization -increase Luvox to 100 mg nightly -continue Trazodone 100 mg nightly PRN  #Opioid withdrawal -symptomatic treatment  #Substance abuse -positive for cocaine, opioids and cannabis -declines substance abuse treatment  Labs -lipid panel, TSH, A1C are normal -EKG reviewed, sinus bradycardia with QTc 406 -pregnancy test is negative  #Disposition -discharge with family -follow up with [facility], MD 05/21/2018, 11:39 AM

## 2018-05-20 NOTE — [facility] Group Notes (Signed)
05/20/2018 1PM  Type of Therapy/Topic:  Group Therapy:  Balance in Life  Participation Level:  Active  Description of Group:   This group will address the concept of balance and how it feels and looks when one is unbalanced. Patients will be encouraged to process areas in their lives that are out of balance and identify reasons for remaining unbalanced. Facilitators will guide patients in utilizing problem-solving interventions to address and correct the stressor making their life unbalanced. Understanding and applying boundaries will be explored and addressed for obtaining and maintaining a balanced life. Patients will be encouraged to explore ways to assertively make their unbalanced needs known to significant others in their lives, using other group members and facilitator for support and feedback.  Therapeutic Goals: 1. Patient will identify two or more emotions or situations they have that consume much of in their lives. 2. Patient will identify signs/triggers that life has become out of balance:  3. Patient will identify two ways to set boundaries in order to achieve balance in their lives:  4. Patient will demonstrate ability to communicate their needs through discussion and/or role plays  Summary of Patient Progress: Patient attended free expressions group which included recreational play and music therapy. Patient interacted appropriately with other group members. Patient is still in the process of meeting their treatment goals.        Therapeutic Modalities:   Cognitive Behavioral Therapy Solution-Focused Therapy Assertiveness Training  Kalan Rinn, LCSW  

## 2018-05-20 NOTE — Plan of Care (Signed)
Miserable, sad, depressed, evasive, hopeless and helpless, "I can't seem to get off this road, I am a drug addict..." PRNs offered and accepted, denied SI/HI/SIB/AVH.  Patient slept for Estimated Hours of 7.30; Precautionary checks every 15 minutes for safety maintained, room free of safety hazards, patient sustains no injury or falls during this shift.  Problem: Activity: Goal: Sleeping patterns will improve Outcome: Progressing   Problem: Health Behavior/Discharge Planning: Goal: Compliance with therapeutic regimen will improve Outcome: Progressing   Problem: Education: Goal: Emotional status will improve Outcome: Not Progressing Goal: Mental status will improve Outcome: Not Progressing   Problem: Activity: Goal: Interest or engagement in activities will improve Outcome: Not Progressing   Problem: Self-Concept: Goal: Will verbalize positive feelings about self Outcome: Not Progressing

## 2018-05-20 NOTE — Progress Notes (Addendum)
Patient was seen in the dayroom, interacting with peers appropriately. Alert and oriented. Expressing hopelessness and helplessness but denying suicidal thoughts. Went to bed early reporting that she was feelings tired. Reported that she is feeling "a little bit" better. Cooperative and compliant with treatment. Received bedtime medication including Trazodone and currently sleeping. Safety and security maintained.  0700: Patient slept all night and woke up in the morning for vital signs. EKG done as ordered. Continues to express helplessness and hopelessness but denies SI. Currently in room and safety precautions reinforced.

## 2018-05-20 NOTE — Plan of Care (Signed)
Compliant with treatment, able to express needs and feelings

## 2018-05-20 NOTE — Progress Notes (Signed)
Recreation Therapy Notes  Date: 05/20/2018  Time: 9:30 am   Location: Craft Room   Behavioral response: N/A   Intervention Topic: Goals  Discussion/Intervention: Patient did not attend group.   Clinical Observations/Feedback:  Patient did not attend group.   Starlyn Droge LRT/CTRS        Caitlyn Buchanan 05/20/2018 11:10 AM

## 2018-05-20 NOTE — [facility] Group Notes (Signed)
[facility] Group Notes:  (Nursing/MHT/Case Management/Adjunct)  Date:  05/20/2018  Time:  1:14 AM  Type of Therapy:  Group Therapy  Participation Level:  Active  Participation Quality:  Appropriate  Affect:  Appropriate  Cognitive:  Appropriate  Insight:  Appropriate  Engagement in Group:  Engaged  Modes of Intervention:  Discussion  Summary of Progress/Problems: Penny Zimmerman stated she did not have a goal on this day. Penny Zimmerman stated her day was ok. Penny Zimmerman did not talk much during group. MHT reviewed rules and expectations of the unit. MHT processed with the patients about goal setting. MHT encouraged patients to work on goals that would prepare them for discharge. MHT addressed concerns about anxiety. MHT offered steps to address anxiety. MHT informed patients they would have to address the issues that cause anxiety. MHT explained how avoiding stressful things that cause anxiety only increases anxiety by avoiding it. MHT reviewed resources available in the community (providers, medication management, psychiatrist). Jinger NeighborsKeith D Rondal Vandevelde 05/20/2018, 1:14 AM

## 2018-05-20 NOTE — Plan of Care (Signed)
Patient up intermittently out of her room today. Observed ambulating on the unit voicing discomfort from her Heroin withdrawal. MD made aware of patient's symptoms. Medicated patient with scheduled and unscheduled medicine. Denies any thoughts of wanting to harm herself. Denies experiencing hallucinations. Milieu remains safe with q 15 minute safety checks.

## 2018-05-21 DIAGNOSIS — F429 Obsessive-compulsive disorder, unspecified: Secondary | ICD-10-CM | POA: Diagnosis present

## 2018-05-21 DIAGNOSIS — F4310 Post-traumatic stress disorder, unspecified: Secondary | ICD-10-CM | POA: Diagnosis present

## 2018-05-21 MED ORDER — FLUVOXAMINE MALEATE 50 MG PO TABS
100.0000 mg | ORAL_TABLET | Freq: Every day | ORAL | Status: DC
  Administered 2018-05-21 – 2018-05-23 (×3): 100 mg via ORAL
  Filled 2018-05-21 (×4): qty 2

## 2018-05-21 NOTE — [facility] Suicide Risk Assessment (Signed)
[facility] INPATIENT:  Family/Significant Other Suicide Prevention Education  Suicide Prevention Education:  Education Completed; Leighton RoachDaniel Fricker (husband, [phone]) has been identified by the patient as the significant other with whom the patient will be residing, and identified as the person who will aid the patient in the event of a mental health crisis (suicidal ideations/suicide attempt).  With written consent from the patient, the significant other has been provided the following suicide prevention education, prior to the and/or following the discharge of the patient.  The suicide prevention education provided includes the following:  Suicide risk factors  Suicide prevention and interventions  National Suicide Hotline telephone number  [facility] assessment telephone number  [facility] and/or Residential Mobile Crisis Unit telephone number  Request made of family/significant other to:  Remove weapons (e.g., guns, rifles, knives), all items previously/currently identified as safety concern.    Remove drugs/medications (over-the-counter, prescriptions, illicit drugs), all items previously/currently identified as a safety concern.  The family member/significant other verbalizes understanding of the suicide prevention education information provided.    Alease FrameSonya S Mithcell Schumpert, LCSW 05/21/2018, 2:54 PM

## 2018-05-21 NOTE — Tx Team (Addendum)
Interdisciplinary Treatment and Diagnostic Plan Update  05/21/2018 Time of Session: 11:30am DANNIEL GRENZ MRN: [id number]  Principal Diagnosis: Bipolar I disorder, most recent episode depressed, severe without psychotic features (HCC)  Secondary Diagnoses: Principal Problem:   Bipolar I disorder, most recent episode depressed, severe without psychotic features (HCC) Active Problems:   Cannabis use disorder, moderate, dependence (HCC)   Opioid use disorder, moderate, dependence (HCC)   Cocaine use disorder, moderate, dependence (HCC)   Tobacco use disorder   Current Medications:  Current Facility-Administered Medications  Medication Dose Route Frequency Provider Last Rate Last Dose  . acetaminophen (TYLENOL) tablet 650 mg  650 mg Oral Q6H PRN Pucilowska, Jolanta B, MD      . alum & mag hydroxide-simeth (MAALOX/MYLANTA) 200-200-20 MG/5ML suspension 30 mL  30 mL Oral Q4H PRN Pucilowska, Jolanta B, MD      . carbamazepine (TEGRETOL) tablet 200 mg  200 mg Oral BID AC & HS Pucilowska, Jolanta B, MD   200 mg at 05/21/18 0834  . cloNIDine (CATAPRES) tablet 0.1 mg  0.1 mg Oral TID Pucilowska, Jolanta B, MD   0.1 mg at 05/20/18 1708  . dicyclomine (BENTYL) tablet 20 mg  20 mg Oral TID AC Pucilowska, Jolanta B, MD   20 mg at 05/21/18 0834  . fluvoxaMINE (LUVOX) tablet 50 mg  50 mg Oral QHS Pucilowska, Jolanta B, MD   50 mg at 05/20/18 2153  . hydrOXYzine (ATARAX/VISTARIL) tablet 50 mg  50 mg Oral TID PRN Pucilowska, Jolanta B, MD   50 mg at 05/20/18 0821  . ibuprofen (ADVIL,MOTRIN) tablet 600 mg  600 mg Oral Q6H PRN Pucilowska, Jolanta B, MD   600 mg at 05/20/18 1413  . loperamide (IMODIUM) capsule 4 mg  4 mg Oral PRN Pucilowska, Jolanta B, MD   4 mg at 05/20/18 1413  . magnesium hydroxide (MILK OF MAGNESIA) suspension 30 mL  30 mL Oral Daily PRN Pucilowska, Jolanta B, MD      . nicotine (NICODERM CQ - dosed in mg/24 hours) patch 21 mg  21 mg Transdermal Daily Pucilowska, Jolanta B, MD   21 mg at  05/21/18 0834  . ondansetron (ZOFRAN) tablet 4 mg  4 mg Oral Q8H PRN Pucilowska, Jolanta B, MD      . traZODone (DESYREL) tablet 100 mg  100 mg Oral QHS PRN Pucilowska, Jolanta B, MD   100 mg at 05/20/18 2153   PTA Medications: Medications Prior to Admission  Medication Sig Dispense Refill Last Dose  . doxycycline (VIBRAMYCIN) 50 MG capsule Take 2 capsules (100 mg total) by mouth 2 (two) times daily. (Patient not taking: Reported on 05/19/2018) 56 capsule 0 Completed Course at Unknown time  . ibuprofen (ADVIL,MOTRIN) 600 MG tablet Take 1 tablet (600 mg total) by mouth every 8 (eight) hours as needed. (Patient not taking: Reported on 05/19/2018) 20 tablet 0 Completed Course at Unknown time  . metroNIDAZOLE (FLAGYL) 500 MG tablet Take 1 tablet (500 mg total) by mouth 2 (two) times daily. (Patient not taking: Reported on 05/19/2018) 28 tablet 0 Completed Course at Unknown time    Patient Stressors: Health problems Substance abuse  Patient Strengths: Ability for insight Wellsite geologist fund of knowledge Motivation for treatment/growth Supportive family/friends  Treatment Modalities: Medication Management, Group therapy, Case management,  1 to 1 session with clinician, Psychoeducation, Recreational therapy.   Physician Treatment Plan for Primary Diagnosis: Bipolar I disorder, most recent episode depressed, severe without psychotic features (HCC) Long Term Goal(s): Improvement in symptoms  so as ready for discharge Improvement in symptoms so as ready for discharge   Short Term Goals: Ability to identify changes in lifestyle to reduce recurrence of condition will improve Ability to verbalize feelings will improve Ability to disclose and discuss suicidal ideas Ability to demonstrate self-control will improve Ability to identify and develop effective coping behaviors will improve Ability to maintain clinical measurements within normal limits will improve Compliance with prescribed  medications will improve Ability to identify triggers associated with substance abuse/mental health issues will improve Ability to identify changes in lifestyle to reduce recurrence of condition will improve Ability to demonstrate self-control will improve Ability to identify triggers associated with substance abuse/mental health issues will improve  Medication Management: Evaluate patient's response, side effects, and tolerance of medication regimen.  Therapeutic Interventions: 1 to 1 sessions, Unit Group sessions and Medication administration.  Evaluation of Outcomes: Progressing   Physician Treatment Plan for Secondary Diagnosis: Principal Problem:   Bipolar I disorder, most recent episode depressed, severe without psychotic features (HCC) Active Problems:   Cannabis use disorder, moderate, dependence (HCC)   Opioid use disorder, moderate, dependence (HCC)   Cocaine use disorder, moderate, dependence (HCC)   Tobacco use disorder  Long Term Goal(s): Improvement in symptoms so as ready for discharge Improvement in symptoms so as ready for discharge   Short Term Goals: Ability to identify changes in lifestyle to reduce recurrence of condition will improve Ability to verbalize feelings will improve Ability to disclose and discuss suicidal ideas Ability to demonstrate self-control will improve Ability to identify and develop effective coping behaviors will improve Ability to maintain clinical measurements within normal limits will improve Compliance with prescribed medications will improve Ability to identify triggers associated with substance abuse/mental health issues will improve Ability to identify changes in lifestyle to reduce recurrence of condition will improve Ability to demonstrate self-control will improve Ability to identify triggers associated with substance abuse/mental health issues will improve     Medication Management: Evaluate patient's response, side effects, and  tolerance of medication regimen.  Therapeutic Interventions: 1 to 1 sessions, Unit Group sessions and Medication administration.  Evaluation of Outcomes: Progressing   RN Treatment Plan for Primary Diagnosis: Bipolar I disorder, most recent episode depressed, severe without psychotic features (HCC) Long Term Goal(s): Knowledge of disease and therapeutic regimen to maintain health will improve  Short Term Goals: Ability to remain free from injury will improve, Ability to participate in decision making will improve, Ability to identify and develop effective coping behaviors will improve and Compliance with prescribed medications will improve  Medication Management: RN will administer medications as ordered by provider, will assess and evaluate patient's response and provide education to patient for prescribed medication. RN will report any adverse and/or side effects to prescribing provider.  Therapeutic Interventions: 1 on 1 counseling sessions, Psychoeducation, Medication administration, Evaluate responses to treatment, Monitor vital signs and CBGs as ordered, Perform/monitor CIWA, COWS, AIMS and Fall Risk screenings as ordered, Perform wound care treatments as ordered.  Evaluation of Outcomes: Progressing   LCSW Treatment Plan for Primary Diagnosis: Bipolar I disorder, most recent episode depressed, severe without psychotic features (HCC) Long Term Goal(s): Safe transition to appropriate next level of care at discharge, Engage patient in therapeutic group addressing interpersonal concerns.  Short Term Goals: Engage patient in aftercare planning with referrals and resources, Increase social support, Facilitate patient progression through stages of change regarding substance use diagnoses and concerns, Identify triggers associated with mental health/substance abuse issues and Increase skills for wellness  and recovery  Therapeutic Interventions: Assess for all discharge needs, 1 to 1 time with  Child psychotherapistocial worker, Explore available resources and support systems, Assess for adequacy in community support network, Educate family and significant other(s) on suicide prevention, Complete Psychosocial Assessment, Interpersonal group therapy.  Evaluation of Outcomes: Progressing   Progress in Treatment: Attending groups: Yes. Participating in groups: Yes. Taking medication as prescribed: Yes. Toleration medication: Yes. Family/Significant other contact made: No, will contact:  Family Member when identified Patient understands diagnosis: Yes. Discussing patient identified problems/goals with staff: Yes. Medical problems stabilized or resolved: Yes. Denies suicidal/homicidal ideation: Yes. Issues/concerns per patient self-inventory: No. Other:  New problem(s) identified: No, Describe:  None  New Short Term/Long Term Goal(s): "To start taking medications."  Patient Goals:  "To start taking medications."  Discharge Plan or Barriers: To return home and follow up with an outpatient provider.  Reason for Continuation of Hospitalization: Medication stabilization Withdrawal symptoms  Estimated Length of Stay: 3-4 days Recreational Therapy: Patient Stressors: N/A Patient Goal: Patient will engage in groups without prompting or encouragement from LRT x3 group sessions within 5 recreation therapy group sessions  Attendees: Patient: Karle Plumberthena Howington 05/21/2018 11:04 AM  Physician: Dr. Jennet MaduroPucilowska, MD 05/21/2018 11:04 AM  Nursing:  05/21/2018 11:04 AM  RN Care Manager: 05/21/2018 11:04 AM  Social Worker:  05/21/2018 11:04 AM  Recreational Therapist: Garret ReddishShay Travante Knee, LRT-CTRS 05/21/2018 11:04 AM  Other: Johny Shearsassandra Jarrett, LCSWA 05/21/2018 11:04 AM  Other:  05/21/2018 11:04 AM  Other: 05/21/2018 11:04 AM    Scribe for Treatment Team: Johny Shearsassandra  Jarrett, LCSW 05/21/2018 11:04 AM

## 2018-05-21 NOTE — Progress Notes (Signed)
Recreation Therapy Notes  INPATIENT RECREATION THERAPY ASSESSMENT  Patient Details Name: Penny Zimmerman MRN: [id number] DOB: 06/05/1986 Today's Date: 05/21/2018       Information Obtained From: Chart Review  Able to Participate in Assessment/Interview:    Patient Presentation: Responsive  Reason for Admission (Per Patient): Active Symptoms, Suicidal Ideation, Other (Comments)(depression)  Patient Stressors:    Coping Skills:   Talk  Leisure Interests (2+):  Social - Database administratoramily(Kayaking)  Frequency of Recreation/Participation: Monthly  Awareness of Community Resources:  Yes  Community Resources:  Other (Comment)([facility])  Current Use:    If no, Barriers?:    Expressed Interest in [employer] Corporation[employer] Resource Information:    [place]  Patient Main Form of Transportation: Car  Patient Strengths:  Talking to others  Patient Identified Areas of Improvement:  My medication  Patient Goal for Hospitalization:  To start my medication  Current SI (including self-harm):     Current HI:     Current AVH:    Staff Intervention Plan: Group Attendance, Collaborate with Interdisciplinary Treatment Team  Consent to Intern Participation: N/A  Ameer Sanden 05/21/2018, 3:18 PM

## 2018-05-21 NOTE — [facility] Group Notes (Signed)
[facility] Group Notes:  (Nursing/MHT/Case Management/Adjunct)  Date:  05/21/2018  Time:  2:11 AM  Type of Therapy:  Group Therapy  Participation Level:  None  Participation Quality:  Appropriate  Affect:  Flat  Cognitive:  Alert and Appropriate  Insight:  Lacking  Engagement in Group:  Limited  Modes of Intervention:  Support  Summary of Progress/Problems:  Penny BeeJessica  Penny Zimmerman 05/21/2018, 2:11 AM

## 2018-05-21 NOTE — Progress Notes (Signed)
Received Penny Zimmerman this AM after breakfast, she was compliant with her medications. Afterwards she returned to her bed. She was OOB for lunch, VS rechecked and medicated per order. She endorsed feeling anxious and depressed, but denied feeling suicidal.  She received a visitor this PM. Her affect is flat and mostly isolates in her room. .Marland Kitchen

## 2018-05-21 NOTE — [facility] Group Notes (Signed)
LCSW Group Therapy Note  05/21/2018 1:00 pm  Type of Therapy and Topic:  Group Therapy:  Feelings around Relapse and Recovery  Participation Level:  Minimal   Description of Group:    Patients in this group will discuss emotions they experience before and after a relapse. They will process how experiencing these feelings, or avoidance of experiencing them, relates to having a relapse. Facilitator will guide patients to explore emotions they have related to recovery. Patients will be encouraged to process which emotions are more powerful. They will be guided to discuss the emotional reaction significant others in their lives may have to their relapse or recovery. Patients will be assisted in exploring ways to respond to the emotions of others without this contributing to a relapse.  Therapeutic Goals: 1. Patient will identify two or more emotions that lead to a relapse for them 2. Patient will identify two emotions that result when they relapse 3. Patient will identify two emotions related to recovery 4. Patient will demonstrate ability to communicate their needs through discussion and/or role plays   Summary of Patient Progress:  Penny Zimmerman was able to participate some in today's group discussion on feelings around relapse and recovery.  Penny Zimmerman shared that she often feels overwhelmed right before she has a relapse and feelings of shame when she is in the middle of a relapse.  Penny Zimmerman struggled to actively engage in the group discussion.     Therapeutic Modalities:   Cognitive Behavioral Therapy Solution-Focused Therapy Assertiveness Training Relapse Prevention Therapy   Alease FrameSonya S Rande Dario, LCSW 05/21/2018 2:00 PM

## 2018-05-22 DIAGNOSIS — F1420 Cocaine dependence, uncomplicated: Secondary | ICD-10-CM

## 2018-05-22 DIAGNOSIS — F1220 Cannabis dependence, uncomplicated: Secondary | ICD-10-CM

## 2018-05-22 DIAGNOSIS — F1120 Opioid dependence, uncomplicated: Secondary | ICD-10-CM

## 2018-05-22 MED ORDER — WHITE PETROLATUM EX OINT
TOPICAL_OINTMENT | CUTANEOUS | Status: AC
  Administered 2018-05-22: 22:00:00
  Filled 2018-05-22: qty 5

## 2018-05-22 NOTE — [facility] Group Notes (Signed)
LCSW Group Therapy Note  05/22/2018 1:15pm  Type of Therapy and Topic:  Group Therapy:  Cognitive Distortions  Participation Level:  Active   Description of Group:    Patients in this group will be introduced to the topic of cognitive distortions.  Patients will identify and describe cognitive distortions, describe the feelings these distortions create for them.  Patients will identify one or more situations in their personal life where they have cognitively distorted thinking and will verbalize challenging this cognitive distortion through positive thinking skills.  Patients will practice the skill of using positive affirmations to challenge cognitive distortions using affirmation cards.    Therapeutic Goals:  1. Patient will identify two or more cognitive distortions they have used 2. Patient will identify one or more emotions that stem from use of a cognitive distortion 3. Patient will demonstrate use of a positive affirmation to counter a cognitive distortion through discussion and/or role play. 4. Patient will describe one way cognitive distortions can be detrimental to wellness   Summary of Patient Progress: The patient reported that he feels "a little better". Patients were introduced to the topic of cognitive distortions. The patient was able to identify and describe cognitive distortions and the feelings these distortions create for him. The patient indicated that his most used unhelpful thinking styles are disqualifying the positive and using critical words like "should". Patient identified a situation in his personal life where he has cognitively distorted thinking and verbalized and challenged this cognitive distortion through positive thinking skills. Patient was able to provide support and validation to other group members.     Therapeutic Modalities:   Cognitive Behavioral Therapy Motivational Interviewing   Armistead Sult  CUEBAS-COLON, LCSW 05/22/2018 12:47 PM

## 2018-05-22 NOTE — Progress Notes (Signed)
Received Penny Zimmerman this AM after breakfast, she was compliant with her medications. She endorse feeling anxious and depressed although it has decreased over the last several days. We talked briefly about her relapse after 13 years. She is making plans to go to therapy after this hospitalization. She was assessed OOB for longer intervals this PM and socializing with select peers.

## 2018-05-22 NOTE — Plan of Care (Signed)
Patient is adjusting well in the unit , maintaining safety , compliant with her medicines, aware of her coping skills , socialize with peers without any issues , voice no complains, denies any SI/HI /AVH support and encouragement is provided , patient is able to voice positive attribute of self, no distress noted. Sleep long hours 15 minute safety checks is in progress.  Problem: Education: Goal: Knowledge of [place] [facility] Education information/materials will improve Outcome: Progressing Goal: Emotional status will improve Outcome: Progressing Goal: Mental status will improve Outcome: Progressing Goal: Verbalization of understanding the information provided will improve Outcome: Progressing   Problem: Activity: Goal: Interest or engagement in activities will improve Outcome: Progressing Goal: Sleeping patterns will improve Outcome: Progressing   Problem: Coping: Goal: Ability to verbalize frustrations and anger appropriately will improve Outcome: Progressing Goal: Ability to demonstrate self-control will improve Outcome: Progressing   Problem: Activity: Goal: Interest or engagement in leisure activities will improve Outcome: Progressing Goal: Imbalance in normal sleep/wake cycle will improve Outcome: Progressing   Problem: Health Behavior/Discharge Planning: Goal: Ability to make decisions will improve Outcome: Progressing Goal: Compliance with therapeutic regimen will improve Outcome: Progressing   Problem: Safety: Goal: Ability to disclose and discuss suicidal ideas will improve Outcome: Progressing Goal: Ability to identify and utilize support systems that promote safety will improve Outcome: Progressing   Problem: Self-Concept: Goal: Will verbalize positive feelings about self Outcome: Progressing Goal: Level of anxiety will decrease Outcome: Progressing   Problem: Health Behavior/Discharge Planning: Goal: Ability to identify changes in lifestyle to reduce  recurrence of condition will improve Outcome: Progressing Goal: Identification of resources available to assist in meeting health care needs will improve Outcome: Progressing   Problem: Physical Regulation: Goal: Complications related to the disease process, condition or treatment will be avoided or minimized Outcome: Progressing   Problem: Education: Goal: Knowledge of General Education information will improve Description Including pain rating scale, medication(s)/side effects and non-pharmacologic comfort measures Outcome: Progressing   Problem: Health Behavior/Discharge Planning: Goal: Ability to manage health-related needs will improve Outcome: Progressing   Problem: Clinical Measurements: Goal: Ability to maintain clinical measurements within normal limits will improve Outcome: Progressing Goal: Will remain free from infection Outcome: Progressing Goal: Diagnostic test results will improve Outcome: Progressing Goal: Respiratory complications will improve Outcome: Progressing Goal: Cardiovascular complication will be avoided Outcome: Progressing   Problem: Activity: Goal: Risk for activity intolerance will decrease Outcome: Progressing   Problem: Nutrition: Goal: Adequate nutrition will be maintained Outcome: Progressing   Problem: Coping: Goal: Level of anxiety will decrease Outcome: Progressing   Problem: Elimination: Goal: Will not experience complications related to bowel motility Outcome: Progressing Goal: Will not experience complications related to urinary retention Outcome: Progressing   Problem: Pain Managment: Goal: General experience of comfort will improve Outcome: Progressing   Problem: Safety: Goal: Ability to remain free from injury will improve Outcome: Progressing   Problem: Skin Integrity: Goal: Risk for impaired skin integrity will decrease Outcome: Progressing

## 2018-05-22 NOTE — Progress Notes (Signed)
[facility] MD Progress Note  05/22/2018 3:32 PM Penny Zimmerman  MRN:  [id number]  Subjective:  Penny Zimmerman is a 32 year old female with a history of depression, anxiety, mood instability and substance abuse (heroin, cocaine and cannabis) admitted for suicidal ideation while intoxicated. She was started on Tegretol and Luvox. Opioid withdrawal is treated symptomatically.  Penny Zimmerman, reports feeling better, physically she feels that the acute withdrawal from opioids might be over. She is more willing and open to discuss substance abuse treatment options today. She said that she has tried suboxone in the past and feels that it made her sick. She is considering methadone if craving is too strong. She knows that MAT (medication assistant treatment) can make sustaining sobriety a bit easier.   No SI. Tolerates meds.    Principal Problem: Bipolar I disorder, most recent episode depressed, severe without psychotic features (HCC) Diagnosis:   Patient Active Problem List   Diagnosis Date Noted  . OCD (obsessive compulsive disorder) [F42.9] 05/21/2018  . PTSD (post-traumatic stress disorder) [F43.10] 05/21/2018  . Bipolar I disorder, most recent episode depressed, severe without psychotic features (HCC) [F31.4] 05/19/2018  . Opioid use disorder, moderate, dependence (HCC) [F11.20] 05/19/2018  . Cocaine use disorder, moderate, dependence (HCC) [F14.20] 05/19/2018  . Tobacco use disorder [F17.200] 05/19/2018  . Cannabis use disorder, moderate, dependence (HCC) [F12.20] 12/04/2015  . Depression [F32.9] 10/24/2015  . 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 Depression 10/24/2015  . 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  For repeat postpartum.      Past Surgical  History:  Procedure Laterality Date  . NO PAST SURGERIES    . TUBAL LIGATION Bilateral 12/14/2015   Procedure: POST PARTUM TUBAL LIGATION;  Surgeon: Hildred Laser, MD;  Location: [facility] ORS;  Service: Gynecology;  Laterality: Bilateral;   Family History:  Family History  Problem Relation Age of Onset  . Diabetes Father   . Hypertension Father   . Hyperlipidemia Father   . Diabetes Paternal Grandmother    Family Psychiatric  History: mothet and sister with bipolar. Social History:  Social History   Substance and Sexual Activity  Alcohol Use No  . Alcohol/week: 0.0 standard drinks     Social History   Substance and Sexual Activity  Drug Use No    Social History   Socioeconomic History  . Marital status: Divorced    Spouse name: Not on file  . Number of children: Not on file  . Years of education: Not on file  . Highest education level: Not on file  Occupational History  . Occupation: Production designer, theatre/television/film    Comment: [employer]  Social Needs  . Financial resource strain: Not on file  . Food insecurity:    Worry: Not on file    Inability: Not on file  . Transportation needs:    Medical: Not on file    Non-medical: Not on file  Tobacco Use  . Smoking status: Current Some Day Smoker    Packs/day: 0.50    Years: 10.00    Pack years: 5.00  . Smokeless tobacco: Never Used  Substance and Sexual Activity  . Alcohol use: No    Alcohol/week: 0.0 standard drinks  . Drug use: No  . Sexual activity: Yes    Partners: Male    Birth  control/protection: Surgical    Comment: Tubal ligation   Lifestyle  . Physical activity:    Days per week: Not on file    Minutes per session: Not on file  . Stress: Not on file  Relationships  . Social connections:    Talks on phone: Not on file    Gets together: Not on file    Attends religious service: Not on file    Active member of club or organization: Not on file    Attends meetings of clubs or organizations: Not on file    Relationship status: Not  on file  Other Topics Concern  . Not on file  Social History Narrative  . Not on file   Additional Social History:    History of alcohol / drug use?: Yes                    Sleep: Fair  Appetite:  Fair  Current Medications: Current Facility-Administered Medications  Medication Dose Route Frequency Provider Last Rate Last Dose  . acetaminophen (TYLENOL) tablet 650 mg  650 mg Oral Q6H PRN Pucilowska, Jolanta B, MD      . alum & mag hydroxide-simeth (MAALOX/MYLANTA) 200-200-20 MG/5ML suspension 30 mL  30 mL Oral Q4H PRN Pucilowska, Jolanta B, MD      . carbamazepine (TEGRETOL) tablet 200 mg  200 mg Oral BID AC & HS Pucilowska, Jolanta B, MD   200 mg at 05/22/18 0821  . cloNIDine (CATAPRES) tablet 0.1 mg  0.1 mg Oral TID Pucilowska, Jolanta B, MD   0.1 mg at 05/21/18 1729  . dicyclomine (BENTYL) tablet 20 mg  20 mg Oral TID AC Pucilowska, Jolanta B, MD   20 mg at 05/22/18 1212  . fluvoxaMINE (LUVOX) tablet 100 mg  100 mg Oral QHS Pucilowska, Jolanta B, MD   100 mg at 05/21/18 2155  . hydrOXYzine (ATARAX/VISTARIL) tablet 50 mg  50 mg Oral TID PRN Pucilowska, Jolanta B, MD   50 mg at 05/20/18 0821  . ibuprofen (ADVIL,MOTRIN) tablet 600 mg  600 mg Oral Q6H PRN Pucilowska, Jolanta B, MD   600 mg at 05/20/18 1413  . loperamide (IMODIUM) capsule 4 mg  4 mg Oral PRN Pucilowska, Jolanta B, MD   4 mg at 05/20/18 1413  . magnesium hydroxide (MILK OF MAGNESIA) suspension 30 mL  30 mL Oral Daily PRN Pucilowska, Jolanta B, MD      . nicotine (NICODERM CQ - dosed in mg/24 hours) patch 21 mg  21 mg Transdermal Daily Pucilowska, Jolanta B, MD   21 mg at 05/22/18 0826  . ondansetron (ZOFRAN) tablet 4 mg  4 mg Oral Q8H PRN Pucilowska, Jolanta B, MD      . traZODone (DESYREL) tablet 100 mg  100 mg Oral QHS PRN Pucilowska, Jolanta B, MD   100 mg at 05/20/18 2153    Lab Results:  No results found for this or any previous visit (from the past 48 hour(s)).  Blood Alcohol level:  Lab Results   Component Value Date   ETH <10 05/18/2018    Metabolic Disorder Labs: Lab Results  Component Value Date   HGBA1C 4.9 05/20/2018   MPG 93.93 05/20/2018   No results found for: PROLACTIN Lab Results  Component Value Date   CHOL 139 05/20/2018   TRIG 97 05/20/2018   HDL 41 05/20/2018   CHOLHDL 3.4 05/20/2018   VLDL 19 05/20/2018   LDLCALC 79 05/20/2018    Physical Findings: AIMS: Facial and  Oral Movements Muscles of Facial Expression: None, normal Lips and Perioral Area: None, normal Jaw: None, normal Tongue: None, normal,Extremity Movements Upper (arms, wrists, hands, fingers): None, normal Lower (legs, knees, ankles, toes): None, normal, Trunk Movements Neck, shoulders, hips: None, normal, Overall Severity Severity of abnormal movements (highest score from questions above): None, normal Incapacitation due to abnormal movements: None, normal Patient's awareness of abnormal movements (rate only patient's report): No Awareness, Dental Status Current problems with teeth and/or dentures?: No Does patient usually wear dentures?: No  CIWA:  CIWA-Ar Total: 0 COWS:  COWS Total Score: 4  Musculoskeletal: Strength & Muscle Tone: within normal limits Gait & Station: normal Patient leans: N/A  Psychiatric Specialty Exam: Physical Exam  Nursing note and vitals reviewed. Psychiatric: Her speech is normal. Her affect is blunt. She is slowed and withdrawn. Cognition and memory are normal. She expresses impulsivity. She exhibits a depressed mood. She expresses suicidal ideation.    Review of Systems  Constitutional: Positive for chills.  Gastrointestinal: Positive for abdominal pain.  Neurological: Negative.   Psychiatric/Behavioral: Positive for depression, substance abuse and suicidal ideas.  All other systems reviewed and are negative.   Blood pressure (!) 116/45, pulse (!) 58, temperature 98.4 F (36.9 C), temperature source Oral, resp. rate 16, height 5\' 8"  (1.727 m),  weight 81.6 kg, last menstrual period 05/11/2018, SpO2 93 %.Body mass index is 27.37 kg/m.  General Appearance: Casual  Eye Contact:  Good  Speech:  Clear and Coherent  Volume:  Normal  Mood:  Depressed  Affect:  Appropriate, Congruent and Depressed  Thought Process:  Coherent and Goal Directed  Orientation:  Full (Time, Place, and Person)  Thought Content:  Logical  Suicidal Thoughts:  No  Homicidal Thoughts:  No  Memory:  Immediate;   Fair Recent;   Fair Remote;   Fair  Judgement:  Poor  Insight:  Lacking  Psychomotor Activity:  Normal  Concentration:  Concentration: Fair and Attention Span: Fair  Recall:  [employer]  Fund of Knowledge:  Fair  Language:  Fair  Akathisia:  No  Handed:  Right  AIMS (if indicated):     Assets:  Communication Skills Desire for Improvement Housing Physical Health Social Support  ADL's:  Intact  Cognition:  WNL  Sleep:  Number of Hours: 7     Treatment Plan Summary: Daily contact with patient to assess and evaluate symptoms and progress in treatment and Medication management   Penny Zimmerman is a 32 year old female with a history of recurrent depression, mood instability and substance abuse admitted for worsening of depression and suicidal ideation in the context of relapse on substances.  #Suicidal ideation, resolved -patient is able to contract for safety  #Mood -continue Tegretol 200 mg BID for mood stabilization -continue Luvox to 100 mg nightly -continue Trazodone 100 mg nightly PRN  #Opioid withdrawal -symptomatic treatment -discussed about MAT (methadone or buprenorphine). [facility] (an OTP: opioid treatment center) might be a good option for the pt at discharge.   #Substance abuse -positive for cocaine, opioids and cannabis -she is more agreeable to substance abuse treatment today.   Labs -lipid panel, TSH, A1C are normal -EKG reviewed, sinus bradycardia with QTc 406 -pregnancy test is  negative  #Disposition -discharge with family -[facility] [place], or [facility] might be an good option for her.    Cedra Villalon, MD 05/22/2018, 3:32 PM

## 2018-05-23 NOTE — Plan of Care (Signed)
Patient is aware of her coping skills and able to identify positive attribute of self , attends group organizes activities and socialize with peers, with out any issues, contract for safety and 15 minute rounding is in progress, patient denies SI/HI/AVH. No distress.   Problem: Education: Goal: Knowledge of [facility] Education information/materials will improve Outcome: Progressing Goal: Emotional status will improve Outcome: Progressing Goal: Mental status will improve Outcome: Progressing Goal: Verbalization of understanding the information provided will improve Outcome: Progressing   Problem: Activity: Goal: Interest or engagement in activities will improve Outcome: Progressing Goal: Sleeping patterns will improve Outcome: Progressing   Problem: Coping: Goal: Ability to verbalize frustrations and anger appropriately will improve Outcome: Progressing Goal: Ability to demonstrate self-control will improve Outcome: Progressing   Problem: Activity: Goal: Interest or engagement in leisure activities will improve Outcome: Progressing Goal: Imbalance in normal sleep/wake cycle will improve Outcome: Progressing   Problem: Health Behavior/Discharge Planning: Goal: Ability to make decisions will improve Outcome: Progressing Goal: Compliance with therapeutic regimen will improve Outcome: Progressing   Problem: Safety: Goal: Ability to disclose and discuss suicidal ideas will improve Outcome: Progressing Goal: Ability to identify and utilize support systems that promote safety will improve Outcome: Progressing   Problem: Self-Concept: Goal: Will verbalize positive feelings about self Outcome: Progressing Goal: Level of anxiety will decrease Outcome: Progressing   Problem: Health Behavior/Discharge Planning: Goal: Ability to identify changes in lifestyle to reduce recurrence of condition will improve Outcome: Progressing Goal: Identification of resources available  to assist in meeting health care needs will improve Outcome: Progressing   Problem: Physical Regulation: Goal: Complications related to the disease process, condition or treatment will be avoided or minimized Outcome: Progressing   Problem: Education: Goal: Knowledge of General Education information will improve Description Including pain rating scale, medication(s)/side effects and non-pharmacologic comfort measures Outcome: Progressing   Problem: Health Behavior/Discharge Planning: Goal: Ability to manage health-related needs will improve Outcome: Progressing   Problem: Clinical Measurements: Goal: Ability to maintain clinical measurements within normal limits will improve Outcome: Progressing Goal: Will remain free from infection Outcome: Progressing Goal: Diagnostic test results will improve Outcome: Progressing Goal: Respiratory complications will improve Outcome: Progressing Goal: Cardiovascular complication will be avoided Outcome: Progressing   Problem: Activity: Goal: Risk for activity intolerance will decrease Outcome: Progressing   Problem: Nutrition: Goal: Adequate nutrition will be maintained Outcome: Progressing   Problem: Coping: Goal: Level of anxiety will decrease Outcome: Progressing   Problem: Elimination: Goal: Will not experience complications related to bowel motility Outcome: Progressing Goal: Will not experience complications related to urinary retention Outcome: Progressing   Problem: Pain Managment: Goal: General experience of comfort will improve Outcome: Progressing   Problem: Safety: Goal: Ability to remain free from injury will improve Outcome: Progressing   Problem: Skin Integrity: Goal: Risk for impaired skin integrity will decrease Outcome: Progressing

## 2018-05-23 NOTE — B[facility] Group Notes (Signed)
LCSW Group Therapy Note 05/23/2018 1:15pm  Type of Therapy and Topic: Group Therapy: Feelings Around Returning Home & Establishing a Supportive Framework and Supporting Oneself When Supports Not Available  Participation Level: Active  Description of Group:  Patients first processed thoughts and feelings about upcoming discharge. These included fears of upcoming changes, lack of change, new living environments, judgements and expectations from others and overall stigma of mental health issues. The group then discussed the definition of a supportive framework, what that looks and feels like, and how do to discern it from an unhealthy non-supportive network. The group identified different types of supports as well as what to do when your family/friends are less than helpful or unavailable  Therapeutic Goals  1. Patient will identify one healthy supportive network that they can use at discharge. 2. Patient will identify one factor of a supportive framework and how to tell it from an unhealthy network. 3. Patient able to identify one coping skill to use when they do not have positive supports from others. 4. Patient will demonstrate ability to communicate their needs through discussion and/or role plays.  Summary of Patient Progress:  The patient reported she feels "better today." Pt engaged during group session. As patients processed their anxiety about discharge and described healthy supports patient shared she is ready to be discharge. She states she is in the "right kind of medicine" she needs.  Patients identified at least one self-care tool they were willing to use after discharge.   Therapeutic Modalities Cognitive Behavioral Therapy Motivational Interviewing   Andras Grunewald  CUEBAS-COLON, LCSW 05/23/2018 2:35 PM

## 2018-05-23 NOTE — Progress Notes (Signed)
[facility] MD Progress Note  05/23/2018 2:40 PM Penny Zimmerman  MRN:  [id number]  Subjective:  Penny Zimmerman is a 32 year old female with a history of depression, anxiety, mood instability and substance abuse (heroin, cocaine and cannabis) admitted for suicidal ideation while intoxicated. She was started on Tegretol and Luvox. Opioid withdrawal is treated symptomatically.  Penny Zimmerman continues to report feeling better and no longer feeling suicidal.  When asked what made her feel better, she said "no drugs for one thing!"  She is very motivated to sustain sobriety, and is open to discuss with MAT and SAIOP as well.   No SI. Tolerates meds.    Principal Problem: Bipolar I disorder, most recent episode depressed, severe without psychotic features (HCC) Diagnosis:   Patient Active Problem List   Diagnosis Date Noted  . OCD (obsessive compulsive disorder) [F42.9] 05/21/2018  . PTSD (post-traumatic stress disorder) [F43.10] 05/21/2018  . Bipolar I disorder, most recent episode depressed, severe without psychotic features (HCC) [F31.4] 05/19/2018  . Opioid use disorder, moderate, dependence (HCC) [F11.20] 05/19/2018  . Cocaine use disorder, moderate, dependence (HCC) [F14.20] 05/19/2018  . Tobacco use disorder [F17.200] 05/19/2018  . Cannabis use disorder, moderate, dependence (HCC) [F12.20] 12/04/2015  . Depression [F32.9] 10/24/2015  . 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 Depression 10/24/2015  . 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  For repeat postpartum.      Past Surgical History:  Procedure Laterality Date  . NO PAST SURGERIES    . TUBAL LIGATION Bilateral 12/14/2015   Procedure: POST PARTUM TUBAL LIGATION;  Surgeon: Hildred Laser, MD;  Location:  [facility] ORS;  Service: Gynecology;  Laterality: Bilateral;   Family History:  Family History  Problem Relation Age of Onset  . Diabetes Father   . Hypertension Father   . Hyperlipidemia Father   . Diabetes Paternal Grandmother    Family Psychiatric  History: mothet and sister with bipolar. Social History:  Social History   Substance and Sexual Activity  Alcohol Use No  . Alcohol/week: 0.0 standard drinks     Social History   Substance and Sexual Activity  Drug Use No    Social History   Socioeconomic History  . Marital status: Divorced    Spouse name: Not on file  . Number of children: Not on file  . Years of education: Not on file  . Highest education level: Not on file  Occupational History  . Occupation: Production designer, theatre/television/film    Comment: [employer]  Social Needs  . Financial resource strain: Not on file  . Food insecurity:    Worry: Not on file    Inability: Not on file  . Transportation needs:    Medical: Not on file    Non-medical: Not on file  Tobacco Use  . Smoking status: Current Some Day Smoker    Packs/day: 0.50    Years: 10.00    Pack years: 5.00  . Smokeless tobacco: Never Used  Substance and Sexual Activity  . Alcohol use: No    Alcohol/week: 0.0 standard drinks  . Drug use: No  . Sexual activity: Yes    Partners: Male    Birth control/protection: Surgical    Comment: Tubal ligation   Lifestyle  . Physical activity:    Days per week: Not on file  Minutes per session: Not on file  . Stress: Not on file  Relationships  . Social connections:    Talks on phone: Not on file    Gets together: Not on file    Attends religious service: Not on file    Active member of club or organization: Not on file    Attends meetings of clubs or organizations: Not on file    Relationship status: Not on file  Other Topics Concern  . Not on file  Social History Narrative  . Not on file   Additional Social History:    History of alcohol / drug use?: Yes   Sleep:  Fair  Appetite:  Fair  Current Medications: Current Facility-Administered Medications  Medication Dose Route Frequency Provider Last Rate Last Dose  . acetaminophen (TYLENOL) tablet 650 mg  650 mg Oral Q6H PRN Pucilowska, Jolanta B, MD      . alum & mag hydroxide-simeth (MAALOX/MYLANTA) 200-200-20 MG/5ML suspension 30 mL  30 mL Oral Q4H PRN Pucilowska, Jolanta B, MD      . carbamazepine (TEGRETOL) tablet 200 mg  200 mg Oral BID AC & HS Pucilowska, Jolanta B, MD   200 mg at 05/23/18 0915  . cloNIDine (CATAPRES) tablet 0.1 mg  0.1 mg Oral TID Pucilowska, Jolanta B, MD   0.1 mg at 05/23/18 0915  . dicyclomine (BENTYL) tablet 20 mg  20 mg Oral TID AC Pucilowska, Jolanta B, MD   20 mg at 05/23/18 1208  . fluvoxaMINE (LUVOX) tablet 100 mg  100 mg Oral QHS Pucilowska, Jolanta B, MD   100 mg at 05/22/18 2113  . hydrOXYzine (ATARAX/VISTARIL) tablet 50 mg  50 mg Oral TID PRN Pucilowska, Jolanta B, MD   50 mg at 05/20/18 0821  . ibuprofen (ADVIL,MOTRIN) tablet 600 mg  600 mg Oral Q6H PRN Pucilowska, Jolanta B, MD   600 mg at 05/20/18 1413  . loperamide (IMODIUM) capsule 4 mg  4 mg Oral PRN Pucilowska, Jolanta B, MD   4 mg at 05/20/18 1413  . magnesium hydroxide (MILK OF MAGNESIA) suspension 30 mL  30 mL Oral Daily PRN Pucilowska, Jolanta B, MD      . nicotine (NICODERM CQ - dosed in mg/24 hours) patch 21 mg  21 mg Transdermal Daily Pucilowska, Jolanta B, MD   21 mg at 05/23/18 0917  . ondansetron (ZOFRAN) tablet 4 mg  4 mg Oral Q8H PRN Pucilowska, Jolanta B, MD      . traZODone (DESYREL) tablet 100 mg  100 mg Oral QHS PRN Pucilowska, Jolanta B, MD   100 mg at 05/22/18 2113    Lab Results:  No results found for this or any previous visit (from the past 48 hour(s)).  Blood Alcohol level:  Lab Results  Component Value Date   ETH <10 05/18/2018    Metabolic Disorder Labs: Lab Results  Component Value Date   HGBA1C 4.9 05/20/2018   MPG 93.93 05/20/2018   No results found for: PROLACTIN Lab  Results  Component Value Date   CHOL 139 05/20/2018   TRIG 97 05/20/2018   HDL 41 05/20/2018   CHOLHDL 3.4 05/20/2018   VLDL 19 05/20/2018   LDLCALC 79 05/20/2018    Physical Findings: AIMS: Facial and Oral Movements Muscles of Facial Expression: None, normal Lips and Perioral Area: None, normal Jaw: None, normal Tongue: None, normal,Extremity Movements Upper (arms, wrists, hands, fingers): None, normal Lower (legs, knees, ankles, toes): None, normal, Trunk Movements Neck, shoulders, hips: None, normal, Overall Severity  Severity of abnormal movements (highest score from questions above): None, normal Incapacitation due to abnormal movements: None, normal Patient's awareness of abnormal movements (rate only patient's report): No Awareness, Dental Status Current problems with teeth and/or dentures?: No Does patient usually wear dentures?: No  CIWA:  CIWA-Ar Total: 0 COWS:  COWS Total Score: 4  Musculoskeletal: Strength & Muscle Tone: within normal limits Gait & Station: normal Patient leans: N/A  Psychiatric Specialty Exam: Physical Exam  Nursing note and vitals reviewed. Psychiatric: Her speech is normal. Her affect is blunt. She is slowed and withdrawn. Cognition and memory are normal. She expresses impulsivity. She exhibits a depressed mood. She expresses suicidal ideation.    Review of Systems  Constitutional: Negative for chills.  Gastrointestinal: Negative for abdominal pain.  Neurological: Negative.   Psychiatric/Behavioral: Positive for substance abuse. Negative for depression and suicidal ideas.  All other systems reviewed and are negative.   Blood pressure (!) 123/58, pulse 61, temperature 98.2 F (36.8 C), temperature source Oral, resp. rate 18, height 5\' 8"  (1.727 m), weight 81.6 kg, last menstrual period 05/11/2018, SpO2 99 %.Body mass index is 27.37 kg/m.  General Appearance: Casual  Eye Contact:  Good  Speech:  Clear and Coherent  Volume:  Normal  Mood:   "much better"  Affect:  Appropriate, Congruent and Depressed  Thought Process:  Coherent and Goal Directed  Orientation:  Full (Time, Place, and Person)  Thought Content:  Logical  Suicidal Thoughts:  No  Homicidal Thoughts:  No  Memory:  Immediate;   Fair Recent;   Fair Remote;   Fair  Judgement:  Poor  Insight:  Lacking  Psychomotor Activity:  Normal  Concentration:  Concentration: Fair and Attention Span: Fair  Recall:  [employer] of Knowledge:  Fair  Language:  Fair  Akathisia:  No  Handed:  Right  AIMS (if indicated):     Assets:  Communication Skills Desire for Improvement Housing Physical Health Social Support  ADL's:  Intact  Cognition:  WNL  Sleep:  Number of Hours: 7     Treatment Plan Summary: Daily contact with patient to assess and evaluate symptoms and progress in treatment and Medication management   Ms. Gomillion is a 32 year old female with a history of recurrent depression, mood instability and substance abuse admitted for worsening of depression and suicidal ideation in the context of relapse on substances.  #Suicidal ideation, resolved -patient is able to contract for safety  #Mood -continue Tegretol 200 mg BID for mood stabilization -continue Luvox to 100 mg nightly -continue Trazodone 100 mg nightly PRN  #Opioid withdrawal -symptomatic treatment -discussed about MAT (methadone or buprenorphine). [facility] (an OTP: opioid treatment center) might be a good option for the pt at discharge.   #Substance abuse -positive for cocaine, opioids and cannabis -she is more agreeable to substance abuse treatment such as SAIOP  Labs -lipid panel, TSH, A1C are normal -EKG reviewed, sinus bradycardia with QTc 406 -pregnancy test is negative  #Disposition -discharge with family -follow up with [facility] in [place], or [facility]) might be an good option for her.    Ronnisha Felber, MD 05/23/2018, 2:40 PM

## 2018-05-23 NOTE — Progress Notes (Signed)
Received Rena this AM after breakfast, she was compliant with her medications. She depression and anxiety is much better and verbalizing more about her relapse. She is talking with her husband and looking forward to his visit tonight.No change in her status this PM.

## 2018-05-24 MED ORDER — TRAZODONE HCL 100 MG PO TABS: 100.0000 mg | ORAL_TABLET | Freq: Every day | ORAL | 1 refills | Status: DC

## 2018-05-24 MED ORDER — HYDROXYZINE HCL 50 MG PO TABS: 50.0000 mg | ORAL_TABLET | Freq: Three times a day (TID) | ORAL | 1 refills | Status: AC | PRN

## 2018-05-24 MED ORDER — CARBAMAZEPINE 200 MG PO TABS: 200.0000 mg | ORAL_TABLET | Freq: Two times a day (BID) | ORAL | 1 refills | Status: DC

## 2018-05-24 MED ORDER — FLUVOXAMINE MALEATE 100 MG PO TABS: 100.0000 mg | ORAL_TABLET | Freq: Every day | ORAL | 1 refills | Status: DC

## 2018-05-24 MED ORDER — TRAZODONE HCL 100 MG PO TABS
100.0000 mg | ORAL_TABLET | Freq: Every day | ORAL | Status: DC
  Filled 2018-05-24: qty 1

## 2018-05-24 NOTE — Progress Notes (Signed)
Patient alert and oriented x 4. Ambulates unit with steady gait. Verbally denies SI/HI/AVH and pain. Patient discharged on above date and time. Verbalized understanding the discharge information provided to patient upon discharge. Patient departed unit with discharge paperwork, prescriptions, 7 day supply of medications and personal belongings. Picked up by her husband to go home with plans to follow up with [facility] in [facility].

## 2018-05-24 NOTE — Progress Notes (Signed)
Recreation Therapy Notes  INPATIENT RECREATION TR PLAN  Patient Details Name: Penny Zimmerman MRN: [id number] DOB: 04-29-1986 Today's Date: 05/24/2018  Rec Therapy Plan Is patient appropriate for Therapeutic Recreation?: Yes Treatment times per week: at least 3 Estimated Length of Stay: 5-7 days TR Treatment/Interventions: Group participation (Comment)  Discharge Criteria Pt will be discharged from therapy if:: Discharged Treatment plan/goals/alternatives discussed and agreed upon by:: Patient/family  Discharge Summary Short term goals set: Patient will engage in groups without prompting or encouragement from LRT x3 group sessions within 5 recreation therapy group sessions Short term goals met: Not met Reason goals not met: Patient spent most of her time in her room Therapeutic equipment acquired: N/A Reason patient discharged from therapy: Discharge from hospital Pt/family agrees with progress & goals achieved: Yes Date patient discharged from therapy: 05/24/18   Dominick Zertuche 05/24/2018, 11:55 AM

## 2018-05-24 NOTE — Progress Notes (Signed)
  [facility] Adult Case Management Discharge Plan :  Will you be returning to the same living situation after discharge:  Yes,    At discharge, do you have transportation home?: Yes,    Do you have the ability to pay for your medications: No.7 day supply provided  Release of information consent forms completed and in the chart;  Patient's signature needed at discharge.  Patient to Follow up at: Follow-up Information    [facility]. Go on 05/25/2018.   Why:  Walk in for initial intake and assessment.  We are unable to schedule appointment today due to [facility] being closed.  Walk in hours are typically Mon-Friday 8-3pm.  Please call if you have further questions or any trouble getting an appointment.  Contact information: [address] [place], [place] [address]  Hours: Mon-Fri 8AM to 5PM   Phone: [phone]  Fax: [phone]           Next level of care provider has access to [facility] Link:no  Safety Planning and Suicide Prevention discussed: Yes,     Have you used any form of tobacco in the last 30 days? (Cigarettes, Smokeless Tobacco, Cigars, and/or Pipes): Yes  Has patient been referred to the Quitline?: Patient refused referral  Patient has been referred for addiction treatment: Yes  Glennon Mac, LCSW 05/24/2018, 2:13 PM

## 2018-05-24 NOTE — Discharge Summary (Signed)
Physician Discharge Summary Note  Patient:  Penny Zimmerman is an 32 y.o., female MRN:  [id number] DOB:  08-02-1986 Patient phone:  [phone] (home)  Patient address:   [address] [place] [address],  Total Time spent with patient: 20 minutes plus 15 min on care coordination and documentation.  Date of Admission:  05/19/2018 Date of Discharge: 05/24/2018  Reason for Admission:  Suicidal ideation.  History of Present Illness:   Identifying data. Penny Zimmerman is a 32 year old female with a history of bipolar disorder.  Chief complaint. "I came for help."  History of present illness. Information was obtained from the patient and the chart.  The patient came to the ER complaining of severe depression and suicidal ideation with a plan to overdose on drugs. She has been increasingly depressed for several days, unable to get out of bed, missing work and neglecting her family. This has happened before bur she never seeks treatment. When she developed thoughts of hurting herself for the first time ever she became frightened and came to the ER. She re;orts excessive sleeping, anhedonia, feeling of guilt hopelessness, helplessness, poor energy and concentration, social isolation, crying spells and heightened anxiety. She denies psychotic symptoms. Ne panic attacks but severe social anxiety always exacerbated by depression, PTSD with nightmares and flashbacks from past abuse and OCD. She had been sober for 12 years up until recently when she relapse on cocaine and heroin. Also positive for cannabis. Last use was two days ago and she started experiencing withdrawal with chills and abdominal cramping.  Past psychiatric history. Long history of mood instability with periods of expansive mood, insomnia, hyperactivity. Never treated, never hospitalized. No suicide attempts. She was in rehab when teenager and stayed sober for extended period of time. Tried Suboxone at [facility] but did not like it. Felt she was  on heroine.  Family psychiatric history. Mother and sister with bipolar.  Social history. Lives with her husband and three children ages 10, 62 and 66. 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   Principal Problem: Bipolar I disorder, most recent episode depressed, severe without psychotic features [facility]) Discharge Diagnoses: Patient Active Problem List   Diagnosis Date Noted  . Bipolar I disorder, most recent episode depressed, severe without psychotic features (HCC) [F31.4] 05/19/2018    Priority: High  . OCD (obsessive compulsive disorder) [F42.9] 05/21/2018  . PTSD (post-traumatic stress disorder) [F43.10] 05/21/2018  . Opioid use disorder, moderate, dependence (HCC) [F11.20] 05/19/2018  . Cocaine use disorder, moderate, dependence (HCC) [F14.20] 05/19/2018  . Tobacco use disorder [F17.200] 05/19/2018  . Cannabis use disorder, moderate, dependence (HCC) [F12.20] 12/04/2015  . Depression [F32.9] 10/24/2015  . 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 Depression 10/24/2015  . 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  For repeat postpartum.      Past Surgical History:  Procedure Laterality Date  . NO PAST SURGERIES    . TUBAL LIGATION Bilateral 12/14/2015   Procedure: POST PARTUM TUBAL LIGATION;  Surgeon: Hildred Laser, MD;  Location: [facility] ORS;  Service: Gynecology;  Laterality: Bilateral;   Family History:  Family History  Problem Relation Age of Onset  . Diabetes Father   . Hypertension Father   . Hyperlipidemia Father   . Diabetes Paternal Grandmother    Social History:  Social History   Substance and Sexual Activity  Alcohol Use  No  . Alcohol/week: 0.0 standard drinks     Social History   Substance and Sexual Activity  Drug Use No    Social History   Socioeconomic History  . Marital status: Divorced    Spouse  name: Not on file  . Number of children: Not on file  . Years of education: Not on file  . Highest education level: Not on file  Occupational History  . Occupation: Production designer, theatre/television/film    Comment: [employer]  Social Needs  . Financial resource strain: Not on file  . Food insecurity:    Worry: Not on file    Inability: Not on file  . Transportation needs:    Medical: Not on file    Non-medical: Not on file  Tobacco Use  . Smoking status: Current Some Day Smoker    Packs/day: 0.50    Years: 10.00    Pack years: 5.00  . Smokeless tobacco: Never Used  Substance and Sexual Activity  . Alcohol use: No    Alcohol/week: 0.0 standard drinks  . Drug use: No  . Sexual activity: Yes    Partners: Male    Birth control/protection: Surgical    Comment: Tubal ligation   Lifestyle  . Physical activity:    Days per week: Not on file    Minutes per session: Not on file  . Stress: Not on file  Relationships  . Social connections:    Talks on phone: Not on file    Gets together: Not on file    Attends religious service: Not on file    Active member of club or organization: Not on file    Attends meetings of clubs or organizations: Not on file    Relationship status: Not on file  Other Topics Concern  . Not on file  Social History Narrative  . Not on file    Hospital Course:   Penny Zimmerman is a 32 year old female with a history of recurrent depression, mood instability and substance abuse admitted for worsening of depression and suicidal ideation in the context of relapse on substances. The patient was started on medications and tolerated them well. At the time of discharge, she was no longer suicidal or homicidal. She was able to contract for safety. She is forward thinking and optimistic about the future. a mood stabilizer  #Mood, improved -continue Tegretol 200 mg BID for mood stabilization -continue Luvox to 100 mg nightly -continue Trazodone 100 mg nightly  #Opioid withdrawal -patient received  symptomatic treatment -discussed about MAT (methadone or buprenorphine). [facility] (an OTP: opioid treatment center) might be a good option for the pt at discharge.   #Substance abuse -positive for cocaine, opioids and cannabis -she is more agreeable to substance abuse treatment such as [facility]  #Soking cessation -nicotine patcgh was available  #Labs  -lipid panel, TSH, A1C are normal -EKG reviewed, sinus bradycardia with QTc 406 -pregnancy test is negative  #Disposition -discharge with family -follow up with [facility] in [place], or [facility]) might be an good option for her.   Physical Findings: AIMS: Facial and Oral Movements Muscles of Facial Expression: None, normal Lips and Perioral Area: None, normal Jaw: None, normal Tongue: None, normal,Extremity Movements Upper (arms, wrists, hands, fingers): None, normal Lower (legs, knees, ankles, toes): None, normal, Trunk Movements Neck, shoulders, hips: None, normal, Overall Severity Severity of abnormal movements (highest score from questions above): None, normal Incapacitation due to abnormal movements: None, normal Patient's awareness of abnormal movements (  rate only patient's report): No Awareness, Dental Status Current problems with teeth and/or dentures?: No Does patient usually wear dentures?: No  CIWA:  CIWA-Ar Total: 0 COWS:  COWS Total Score: 4  Musculoskeletal: Strength & Muscle Tone: within normal limits Gait & Station: normal Patient leans: N/A  Psychiatric Specialty Exam: Physical Exam  Nursing note and vitals reviewed. Psychiatric: She has a normal mood and affect. Her speech is normal and behavior is normal. Thought content normal. Cognition and memory are normal. She expresses impulsivity.    Review of Systems  Neurological: Negative.   Psychiatric/Behavioral: Positive for substance abuse.  All other systems reviewed and are negative.   Blood pressure  108/64, pulse 64, temperature 98 F (36.7 C), temperature source Oral, resp. rate 18, height 5\' 8"  (1.727 m), weight 81.6 kg, last menstrual period 05/11/2018, SpO2 100 %.Body mass index is 27.37 kg/m.  General Appearance: Casual  Eye Contact:  Good  Speech:  Clear and Coherent  Volume:  Normal  Mood:  Euthymic  Affect:  Appropriate  Thought Process:  Goal Directed and Descriptions of Associations: Intact  Orientation:  Full (Time, Place, and Person)  Thought Content:  WDL  Suicidal Thoughts:  No  Homicidal Thoughts:  No  Memory:  Immediate;   Fair Recent;   Fair Remote;   Fair  Judgement:  Impaired  Insight:  Shallow  Psychomotor Activity:  Normal  Concentration:  Concentration: Fair and Attention Span: Fair  Recall:  [employer] of Knowledge:  Fair  Language:  Fair  Akathisia:  No  Handed:  Right  AIMS (if indicated):     Assets:  Communication Skills Desire for Improvement Housing Intimacy Physical Health Resilience Social Support  ADL's:  Intact  Cognition:  WNL  Sleep:  Number of Hours: 7.75     Have you used any form of tobacco in the last 30 days? (Cigarettes, Smokeless Tobacco, Cigars, and/or Pipes): Yes  Has this patient used any form of tobacco in the last 30 days? (Cigarettes, Smokeless Tobacco, Cigars, and/or Pipes) Yes, Yes, A prescription for an FDA-approved tobacco cessation medication was offered at discharge and the patient refused  Blood Alcohol level:  Lab Results  Component Value Date   ETH <10 05/18/2018    Metabolic Disorder Labs:  Lab Results  Component Value Date   HGBA1C 4.9 05/20/2018   MPG 93.93 05/20/2018   No results found for: PROLACTIN Lab Results  Component Value Date   CHOL 139 05/20/2018   TRIG 97 05/20/2018   HDL 41 05/20/2018   CHOLHDL 3.4 05/20/2018   VLDL 19 05/20/2018   LDLCALC 79 05/20/2018    See Psychiatric Specialty Exam and Suicide Risk Assessment completed by Attending Physician prior to  discharge.  Discharge destination:  Home  Is patient on multiple antipsychotic therapies at discharge:  No   Has Patient had three or more failed trials of antipsychotic monotherapy by history:  No  Recommended Plan for Multiple Antipsychotic Therapies: NA  Discharge Instructions    Diet - low sodium heart healthy   Complete by:  As directed    Increase activity slowly   Complete by:  As directed      Allergies as of 05/24/2018   No Known Allergies     Medication List    STOP taking these medications   doxycycline 50 MG capsule Commonly known as:  VIBRAMYCIN   ibuprofen 600 MG tablet Commonly known as:  ADVIL,MOTRIN   metroNIDAZOLE 500 MG tablet Commonly known  as:  FLAGYL     TAKE these medications     Indication  carbamazepine 200 MG tablet Commonly known as:  TEGRETOL Take 1 tablet (200 mg total) by mouth 2 (two) times daily at 8 am and 10 pm.  Indication:  Manic-Depression   fluvoxaMINE 100 MG tablet Commonly known as:  LUVOX Take 1 tablet (100 mg total) by mouth at bedtime.  Indication:  Major Depressive Disorder, Obsessive Compulsive Disorder, Posttraumatic Stress Disorder   hydrOXYzine 50 MG tablet Commonly known as:  ATARAX/VISTARIL Take 1 tablet (50 mg total) by mouth 3 (three) times daily as needed for anxiety.  Indication:  Feeling Anxious   traZODone 100 MG tablet Commonly known as:  DESYREL Take 1 tablet (100 mg total) by mouth at bedtime.  Indication:  Trouble Sleeping      Follow-up Information    [facility] Go on 05/25/2018.   Why:  Walk in for initial intake and assessment.  We are unable to schedule appointment today due to [facility] being closed.  Walk in hours are typically Mon-Friday 8-3pm.  Please call if you have further questions or any trouble getting an appointment.  Contact information: [address] [place], [place] [address]  Hours: Bettye Boeck to 5PM   Phone: [phone]  Fax: [phone]           Follow-up  recommendations:  Activity:  as tolerated Diet:  low sodium heart healthy Other:  keep follow up appointments  Comments:    Signed: Kristine Linea, MD 05/24/2018, 1:58 PM

## 2018-05-24 NOTE — Plan of Care (Addendum)
Patient found in day room upon my arrival. Patient is visible and social this evening. Patient is pleasant and cooperative though minimally verbal. Denies SI/HI/AVH. Reports improvement in mood but continues to report lingering anxiety. Reports eating and voiding adequately. Compliant with HS medications and staff direction. Given Trazodone PRN with positive results. Q 15 minute checks maintained. Will continue to monitor throughout the shift. Patient slept 7.75 hours. No apparent distress. Patient compliant with am vitals. VSS. Will endorse care to oncoming shift.  Problem: Education: Goal: Knowledge of [facility] Education information/materials will improve Outcome: Progressing Goal: Emotional status will improve Outcome: Progressing Goal: Mental status will improve Outcome: Progressing Goal: Verbalization of understanding the information provided will improve Outcome: Progressing   Problem: Activity: Goal: Interest or engagement in activities will improve Outcome: Progressing Goal: Sleeping patterns will improve Outcome: Progressing   Problem: Coping: Goal: Ability to verbalize frustrations and anger appropriately will improve Outcome: Progressing Goal: Ability to demonstrate self-control will improve Outcome: Progressing

## 2018-05-24 NOTE — [facility] Suicide Risk Assessment (Signed)
[facility] Discharge Suicide Risk Assessment   Principal Problem: Bipolar I disorder, most recent episode depressed, severe without psychotic features [facility]) Discharge Diagnoses:  Patient Active Problem List   Diagnosis Date Noted  . Bipolar I disorder, most recent episode depressed, severe without psychotic features (HCC) [F31.4] 05/19/2018    Priority: High  . OCD (obsessive compulsive disorder) [F42.9] 05/21/2018  . PTSD (post-traumatic stress disorder) [F43.10] 05/21/2018  . Opioid use disorder, moderate, dependence (HCC) [F11.20] 05/19/2018  . Cocaine use disorder, moderate, dependence (HCC) [F14.20] 05/19/2018  . Tobacco use disorder [F17.200] 05/19/2018  . Cannabis use disorder, moderate, dependence (HCC) [F12.20] 12/04/2015  . Depression [F32.9] 10/24/2015  . 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   Psychiatric/Behavioral: Negative.   All other systems reviewed and are negative.   Blood pressure 108/64, pulse 64, temperature 98 F (36.7 C), temperature source Oral, resp. rate 18, height 5\' 8"  (1.727 m), weight 81.6 kg, last menstrual period 05/11/2018, SpO2 100 %.Body mass index is 27.37 kg/m.  General Appearance: Casual  Eye Contact::  Good  Speech:  Clear and Coherent409  Volume:  Normal  Mood:  Euthymic  Affect:  Appropriate  Thought Process:  Goal Directed and Descriptions of Associations: Intact  Orientation:  Full (Time, Place, and Person)  Thought Content:  WDL  Suicidal Thoughts:  No  Homicidal Thoughts:  No  Memory:  Immediate;   Fair Recent;   Fair Remote;   Fair  Judgement:  Impaired  Insight:  Shallow  Psychomotor Activity:  Normal  Concentration:  Fair  Recall:  [employer] of Knowledge:Fair   Language: Fair  Akathisia:  No  Handed:  Right  AIMS (if indicated):     Assets:  Communication Skills Desire for Improvement Housing Intimacy Physical Health Resilience Social Support  Sleep:  Number of Hours: 7.75  Cognition: WNL  ADL's:  Intact   Mental Status Per Nursing Assessment::   On Admission:  NA  Demographic Factors:  Caucasian  Loss Factors: NA  Historical Factors: Family history of mental illness or substance abuse and Impulsivity  Risk Reduction Factors:   Responsible for children under 56 years of age, Sense of responsibility to family, Living with another person, especially a relative and Positive social support  Continued Clinical Symptoms:  Bipolar Disorder:   Depressive phase Alcohol/Substance Abuse/Dependencies Obsessive-Compulsive Disorder  Cognitive Features That Contribute To Risk:  None    Suicide Risk:  Minimal: No identifiable suicidal ideation.  Patients presenting with no risk factors but with morbid ruminations; may be classified as minimal risk based on the severity of the depressive symptoms    Plan Of Care/Follow-up recommendations:  Activity:  as tolerated Diet:  low sodium heart healthy Other:  keep follow up appointments  Kristine Linea, MD 05/24/2018, 1:26 PM

## 2018-06-17 IMAGING — US US TRANSVAGINAL NON-OB
1 series · 13 of 25 positions shown · non-contrast
Comparison: 04/23/2015

CLINICAL DATA: Pelvic pain and vaginal discharge



[Series 1: us transvaginal non-ob · 0.24mm/px · 13 of 114 slices shown]
[im 1/114]
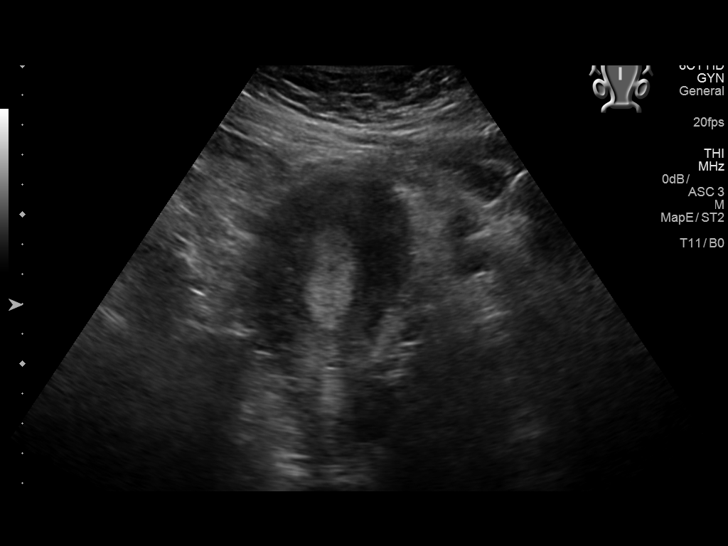
[im 10/114]
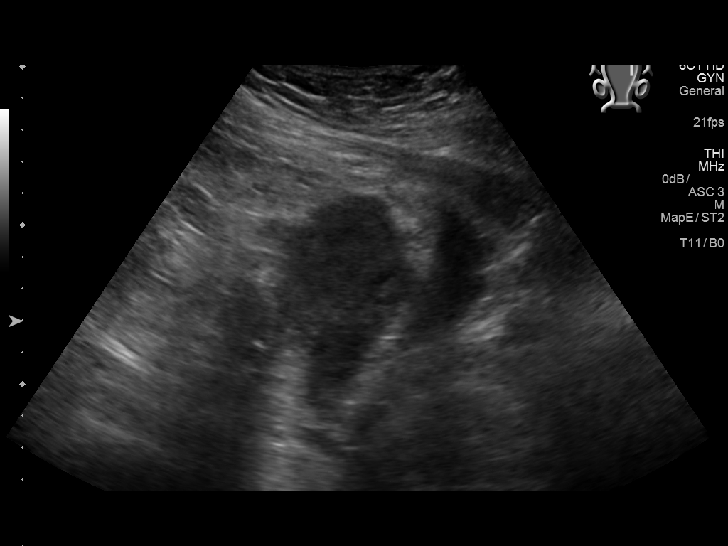
[im 19/114]
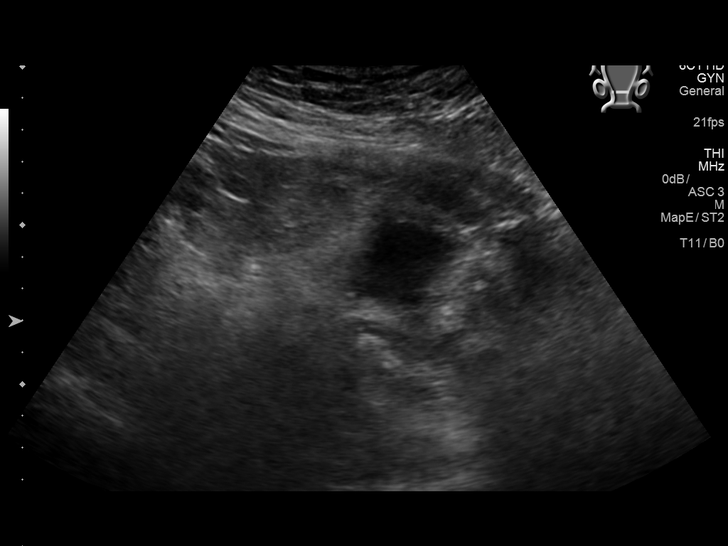
[im 29/114]
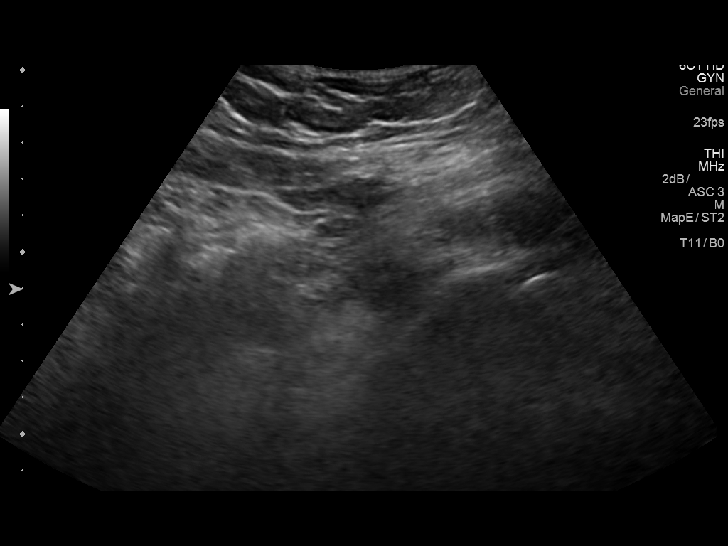
[im 38/114]
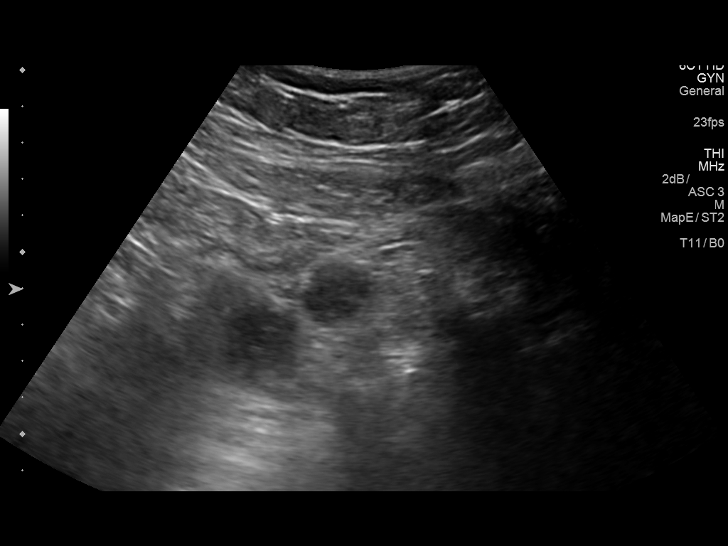
[im 48/114]
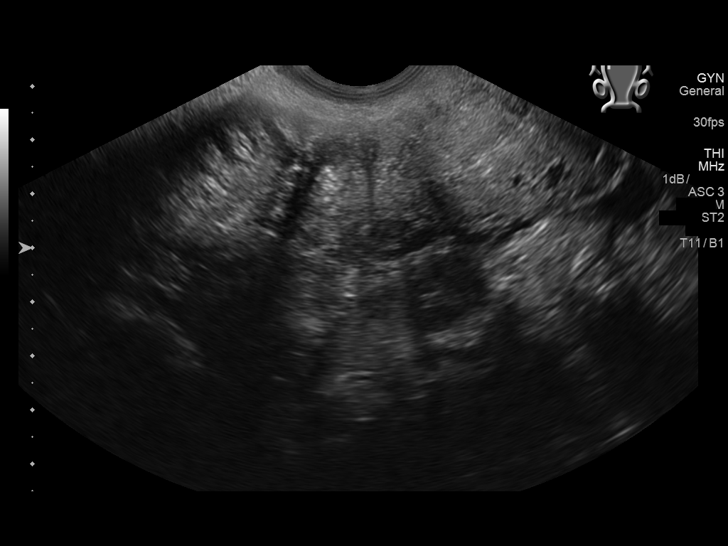
[im 57/114]
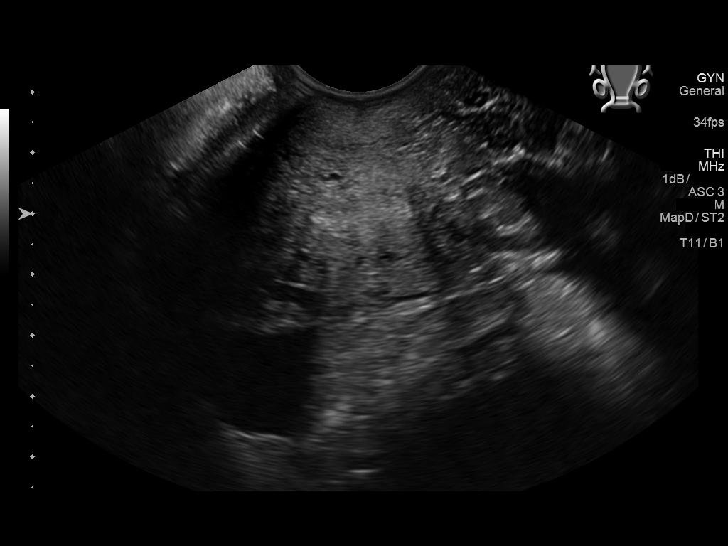
[im 66/114]
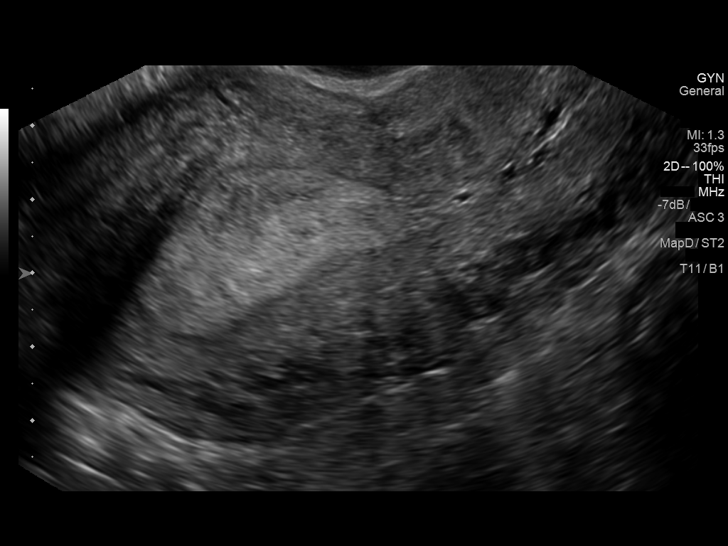
[im 76/114]
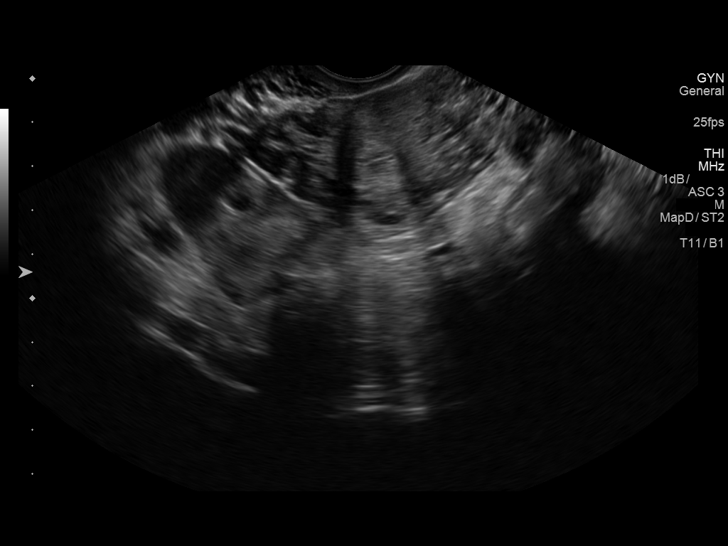
[im 85/114]
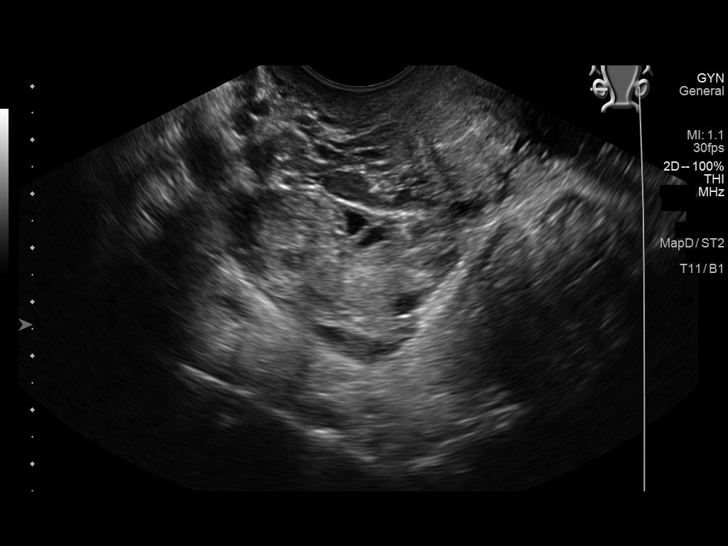
[im 95/114]
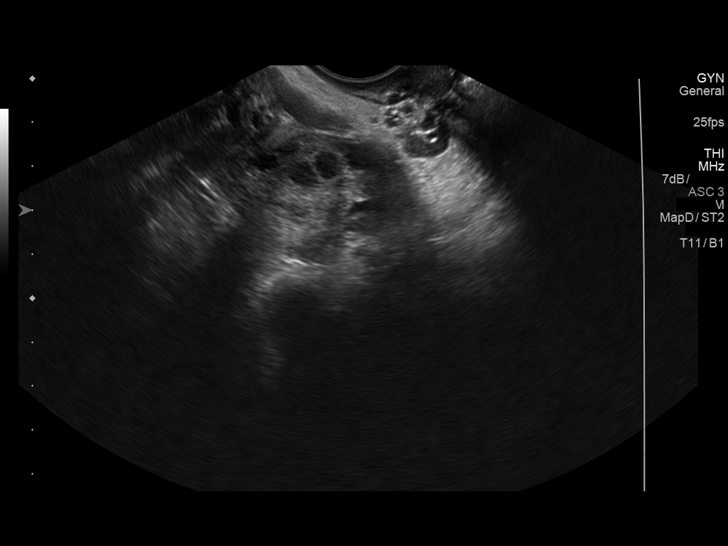
[im 104/114]
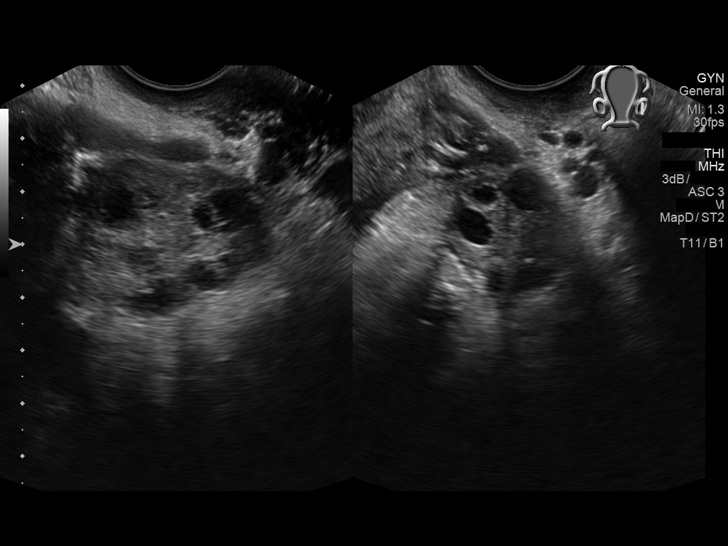
[im 114/114]
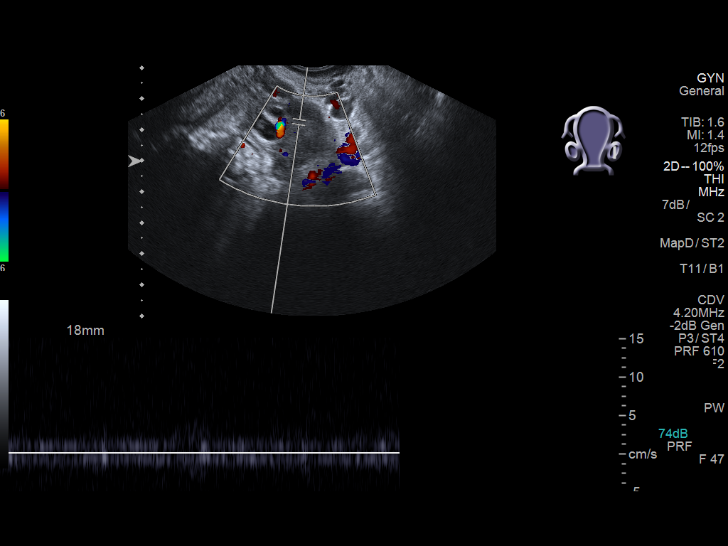

[13 of 25 positions shown; findings below may reference images not displayed]

FINDINGS: Uterus

Measurements: 10 x 5 x 7 cm. No fibroids or other mass visualized.

Endometrium

Thickness: 19 mm.  No focal abnormality visualized.

Right ovary

Measurements: 38 x 36 x 39 mm. Normal appearance/no adnexal mass.

Left ovary

Measurements: 30 x 42 x 25 mm. Normal appearance/no adnexal mass.

Pulsed Doppler evaluation of both ovaries demonstrates normal
low-resistance arterial and venous waveforms.

Other findings

Small simple pelvic fluid.
IMPRESSION: 1. Negative for torsion or other acute finding.
2. Trace pelvic fluid, often physiologic.

## 2021-01-19 ENCOUNTER — Other Ambulatory Visit: Payer: Self-pay

## 2021-01-19 ENCOUNTER — Emergency Department: Payer: Medicaid Other

## 2021-01-19 ENCOUNTER — Encounter: Payer: Self-pay | Admitting: Emergency Medicine

## 2021-01-19 ENCOUNTER — Emergency Department
Admission: EM | Admit: 2021-01-19 | Discharge: 2021-01-20 | Disposition: A | Discharge status: Home / Self Care | Payer: Medicaid Other | Attending: Emergency Medicine | Admitting: Emergency Medicine

## 2021-01-19 DIAGNOSIS — L02419 Cutaneous abscess of limb, unspecified: Secondary | ICD-10-CM

## 2021-01-19 DIAGNOSIS — Z20822 Contact with and (suspected) exposure to covid-19: Secondary | ICD-10-CM | POA: Insufficient documentation

## 2021-01-19 DIAGNOSIS — F1910 Other psychoactive substance abuse, uncomplicated: Secondary | ICD-10-CM | POA: Diagnosis not present

## 2021-01-19 DIAGNOSIS — L02414 Cutaneous abscess of left upper limb: Secondary | ICD-10-CM | POA: Insufficient documentation

## 2021-01-19 DIAGNOSIS — F17200 Nicotine dependence, unspecified, uncomplicated: Secondary | ICD-10-CM | POA: Diagnosis not present

## 2021-01-19 DIAGNOSIS — F1110 Opioid abuse, uncomplicated: Secondary | ICD-10-CM

## 2021-01-19 DIAGNOSIS — R52 Pain, unspecified: Secondary | ICD-10-CM

## 2021-01-19 DIAGNOSIS — L03114 Cellulitis of left upper limb: Secondary | ICD-10-CM | POA: Diagnosis not present

## 2021-01-19 DIAGNOSIS — F1990 Other psychoactive substance use, unspecified, uncomplicated: Secondary | ICD-10-CM

## 2021-01-19 DIAGNOSIS — M79602 Pain in left arm: Secondary | ICD-10-CM | POA: Diagnosis present

## 2021-01-19 DIAGNOSIS — D649 Anemia, unspecified: Secondary | ICD-10-CM

## 2021-01-19 DIAGNOSIS — L0291 Cutaneous abscess, unspecified: Secondary | ICD-10-CM

## 2021-01-19 LAB — CBC WITH DIFFERENTIAL/PLATELET
Abs Immature Granulocytes: 0.02 10*3/uL (ref 0.00–0.07)
Basophils Absolute: 0 10*3/uL (ref 0.0–0.1)
Basophils Relative: 1 %
Eosinophils Absolute: 0.4 10*3/uL (ref 0.0–0.5)
Eosinophils Relative: 5 %
HCT: 30.1 % — ABNORMAL LOW (ref 36.0–46.0)
Hemoglobin: 8.9 g/dL — ABNORMAL LOW (ref 12.0–15.0)
Immature Granulocytes: 0 %
Lymphocytes Relative: 28 %
Lymphs Abs: 2.4 10*3/uL (ref 0.7–4.0)
MCH: 22.4 pg — ABNORMAL LOW (ref 26.0–34.0)
MCHC: 29.6 g/dL — ABNORMAL LOW (ref 30.0–36.0)
MCV: 75.6 fL — ABNORMAL LOW (ref 80.0–100.0)
Monocytes Absolute: 0.6 10*3/uL (ref 0.1–1.0)
Monocytes Relative: 7 %
Neutro Abs: 5 10*3/uL (ref 1.7–7.7)
Neutrophils Relative %: 59 %
Platelets: 292 10*3/uL (ref 150–400)
RBC: 3.98 MIL/uL (ref 3.87–5.11)
RDW: 14.4 % (ref 11.5–15.5)
WBC: 8.5 10*3/uL (ref 4.0–10.5)
nRBC: 0 % (ref 0.0–0.2)

## 2021-01-19 LAB — BASIC METABOLIC PANEL
Anion gap: 8 (ref 5–15)
BUN: 7 mg/dL (ref 6–20)
CO2: 24 mmol/L (ref 22–32)
Calcium: 8.8 mg/dL — ABNORMAL LOW (ref 8.9–10.3)
Chloride: 103 mmol/L (ref 98–111)
Creatinine, Ser: 0.52 mg/dL (ref 0.44–1.00)
GFR, Estimated: >60 mL/min (ref >60)
Glucose, Bld: 95 mg/dL (ref 70–99)
Potassium: 3.6 mmol/L (ref 3.5–5.1)
Sodium: 135 mmol/L (ref 135–145)

## 2021-01-19 MED ORDER — MORPHINE SULFATE (PF) 4 MG/ML IV SOLN
4.0000 mg | Freq: Once | INTRAVENOUS | Status: AC
  Administered 2021-01-19: 4 mg via INTRAVENOUS
  Filled 2021-01-19: qty 1

## 2021-01-19 MED ORDER — SODIUM CHLORIDE 0.9 % IV BOLUS
1000.0000 mL | Freq: Once | INTRAVENOUS | Status: AC
  Administered 2021-01-19: 1000 mL via INTRAVENOUS

## 2021-01-19 MED ORDER — VANCOMYCIN HCL IN DEXTROSE 1-5 GM/200ML-% IV SOLN
1000.0000 mg | Freq: Once | INTRAVENOUS | Status: AC
  Administered 2021-01-19: 1000 mg via INTRAVENOUS
  Filled 2021-01-19: qty 200

## 2021-01-19 NOTE — ED Notes (Signed)
lactic acid sent to lab

## 2021-01-19 NOTE — ED Triage Notes (Signed)
Pt reports she is a heroin addict, reports pain to left arm for past 2 days, noticed redness, swelling and warm to touch left arm. Pt denies any fever at home. Pt talks in complete sentences no distress noted

## 2021-01-19 NOTE — ED Provider Notes (Signed)
[facility] Emergency Department Provider Note   ____________________________________________   Event Date/Time   First MD Initiated Contact with Patient 01/19/21 2316     (approximate)  I have reviewed the triage vital signs and the nursing notes.   HISTORY  Chief Complaint Cellulitis    HPI Penny Zimmerman is a 35 y.o. female who presents to the ED from home with a chief complaint of pain, swelling and redness to her left arm.  Patient uses IV heroin.  Endorses a 2-day history of swelling, pain and redness to her left antecubital area.  Denies fever, chills, cough, chest pain, shortness of breath, abdominal pain, nausea or vomiting.  Patient is right-hand dominant.     Past Medical History:  Diagnosis Date  . Depression 10/24/2015  . 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  For repeat postpartum.      Patient Active Problem List   Diagnosis Date Noted  . Heroin abuse (HCC) 01/20/2021  . Cellulitis of left upper extremity 01/20/2021  . Abscess of antecubital fossa 01/20/2021  . Cellulitis of left arm 01/20/2021  . Anemia 01/20/2021  . OCD (obsessive compulsive disorder) 05/21/2018  . PTSD (post-traumatic stress disorder) 05/21/2018  . Bipolar I disorder, most recent episode depressed, severe without psychotic features (HCC) 05/19/2018  . Opioid use disorder, moderate, dependence (HCC) 05/19/2018  . Cocaine use disorder, moderate, dependence (HCC) 05/19/2018  . Tobacco use disorder 05/19/2018  . Cannabis use disorder, moderate, dependence (HCC) 12/04/2015  . Depression 10/24/2015  . 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 NO PAST SURGERIES    . TUBAL LIGATION Bilateral 12/14/2015   Procedure: POST PARTUM TUBAL LIGATION;  Surgeon: Hildred Laser, MD;  Location: [facility] ORS;  Service: Gynecology;  Laterality: Bilateral;     Prior to Admission medications   Medication Sig Start Date End Date Taking? Authorizing Provider  clindamycin (CLEOCIN) 300 MG capsule Take 1 capsule (300 mg total) by mouth 3 (three) times daily. 01/20/21  Yes Irean Hong, MD  carbamazepine (TEGRETOL) 200 MG tablet Take 1 tablet (200 mg total) by mouth 2 (two) times daily at 8 am and 10 pm. 05/24/18   Pucilowska, Jolanta B, MD  fluvoxaMINE (LUVOX) 100 MG tablet Take 1 tablet (100 mg total) by mouth at bedtime. 05/24/18   Pucilowska, Braulio Conte B, MD  hydrOXYzine (ATARAX/VISTARIL) 50 MG tablet Take 1 tablet (50 mg total) by mouth 3 (three) times daily as needed for anxiety. 05/24/18   Pucilowska, Braulio Conte B, MD  traZODone (DESYREL) 100 MG tablet Take 1 tablet (100 mg total) by mouth at bedtime. 05/24/18   Pucilowska, Ellin Goodie, MD    Allergies Patient has no known allergies.  Family History  Problem Relation Age of Onset  . Diabetes Father   . Hypertension Father   . Hyperlipidemia Father   . Diabetes Paternal Grandmother     Social History Social History   Tobacco Use  . Smoking status: Current Some Day Smoker    Packs/day: 0.50    Years: 10.00    Pack years: 5.00  . Smokeless tobacco: Never Used  Substance Use Topics  . Alcohol use: No    Alcohol/week: 0.0 standard drinks  . Drug use: No    Review of Systems  Constitutional: No fever/chills Eyes: No visual changes. ENT: No sore throat. Cardiovascular: Denies chest pain. Respiratory: Denies shortness of breath. Gastrointestinal:  No abdominal pain.  No nausea, no vomiting.  No diarrhea.  No constipation. Genitourinary: Negative for dysuria. Musculoskeletal: Positive for left arm pain, swelling and redness.  Negative for back pain. Skin: Negative for rash. Neurological: Negative for headaches, focal weakness or numbness.   ____________________________________________   PHYSICAL EXAM:  VITAL SIGNS: ED Triage Vitals  Enc Vitals Group     BP 01/19/21 2109 129/77      Pulse Rate 01/19/21 2109 77     Resp 01/19/21 2109 20     Temp 01/19/21 2109 98.2 F (36.8 C)     Temp Source 01/19/21 2109 Oral     SpO2 01/19/21 2109 100 %     Weight 01/19/21 2110 195 lb (88.5 kg)     Height 01/19/21 2110 5\' 8"  (1.727 m)     Head Circumference --      Peak Flow --      Pain Score 01/19/21 2110 10     Pain Loc --      Pain Edu? --      Excl. in GC? --     Constitutional: Alert and oriented. Well appearing and in mild acute distress. Eyes: Conjunctivae are normal. PERRL. EOMI. Head: Atraumatic. Nose: No congestion/rhinnorhea. Mouth/Throat: Mucous membranes are moist.   Neck: No stridor.   Cardiovascular: Normal rate, regular rhythm. Grossly normal heart sounds.  Good peripheral circulation. Respiratory: Normal respiratory effort.  No retractions. Lungs CTAB. Gastrointestinal: Soft and nontender. No distention. No abdominal bruits. No CVA tenderness. Musculoskeletal:  LUE: Warmth, erythema and fluctuance to left antecubital area.  2+ radial pulse.  Brisk, less than 5-second capillary refill. No lower extremity tenderness nor edema.  No joint effusions. Neurologic:  Normal speech and language. No gross focal neurologic deficits are appreciated. No gait instability. Skin:  Skin is warm, dry and intact. No rash noted. Psychiatric: Mood and affect are normal. Speech and behavior are normal.  ____________________________________________   LABS (all labs ordered are listed, but only abnormal results are displayed)  Labs Reviewed  BASIC METABOLIC PANEL - Abnormal; Notable for the following components:      Result Value   Calcium 8.8 (*)    All other components within normal limits  CBC WITH DIFFERENTIAL/PLATELET - Abnormal; Notable for the following components:   Hemoglobin 8.9 (*)    HCT 30.1 (*)    MCV 75.6 (*)    MCH 22.4 (*)    MCHC 29.6 (*)    All other components within normal limits  IRON AND TIBC - Abnormal; Notable for the following components:    Iron 14 (*)    Saturation Ratios 3 (*)    All other components within normal limits  FERRITIN - Abnormal; Notable for the following components:   Ferritin 5 (*)    All other components within normal limits  RESP PANEL BY RT-PCR (FLU A&B, COVID) ARPGX2  CULTURE, BLOOD (ROUTINE X 2)  CULTURE, BLOOD (ROUTINE X 2)  LACTIC ACID, PLASMA  FOLATE  HIV ANTIBODY (ROUTINE TESTING W REFLEX)  VITAMIN B12  POC URINE PREG, ED   ____________________________________________  EKG  None ____________________________________________  RADIOLOGY I, Aldwin Micalizzi J, personally viewed and evaluated these images (plain radiographs) as part of my medical decision making, as well as reviewing the written report by the radiologist.  ED MD interpretation: Abscess with surrounding soft tissue edema and cellulitis  Official radiology report(s): 2111 LT UPPER EXTREM LTD SOFT TISSUE NON VASCULAR  Result Date: 01/20/2021 CLINICAL DATA:  IVDA, erythema and  swelling at the antecubital fossa EXAM: ULTRASOUND LEFT UPPER EXTREMITY LIMITED TECHNIQUE: Ultrasound examination of the upper extremity soft tissues was performed in the area of clinical concern. COMPARISON:  None. FINDINGS: Targeted grayscale and color Doppler ultrasound evaluation of the left antecubital fossa reveals a heterogeneous, complex loculated fluid collection with surrounding soft tissue edema adjacent to the cephalic vein, concerning for developing abscess with overlying cellulitic features. Collection measures approximately 1.7 x 1.9 x 1.2 cm in size. No clear superficial venous thrombus within the cephalic vein. IMPRESSION: 1.7 x 1.9 x 1.2 cm heterogeneous, complex collection in the antecubital fossa adjacent the cephalic vein concerning for abscess with surrounding soft tissue edema and overlying cellulitic features. No associated superficial venous thrombus the cephalic vein. Electronically Signed   By: Kreg Shropshire M.D.   On: 01/20/2021 00:46     ____________________________________________   PROCEDURES  Procedure(s) performed (including Critical Care):  Procedures   ____________________________________________   INITIAL IMPRESSION / ASSESSMENT AND PLAN / ED COURSE  As part of my medical decision making, I reviewed the following data within the electronic MEDICAL RECORD NUMBER Nursing notes reviewed and incorporated, Labs reviewed, Old chart reviewed, Discussed with admitting physician and Notes from prior ED visits     35 year old IVDA presenting with LUE pain, redness and swelling.  Differential diagnosis includes but is not limited to abscess, cellulitis, sepsis, etc.  Basic labs unremarkable.  Will check blood cultures, lactic acid, soft tissue ultrasound.  Initiate IV antibiotic, IV analgesic.  Anticipate hospitalization.  Clinical Course as of 01/20/21 0319  Wynelle Link Jan 20, 2021  0052 Updated patient of all test results.  Will discuss with hospitalist services for admission. [JS]  0220 Although we had discussed hospitalization previously, patient is now upset at the prospect of being hospitalized.  We discussed risks and benefits of oral antibiotics; I recommended against it but patient desires to leave AGAINST MEDICAL ADVICE.  Will prescribe clindamycin.  Very strict return precautions given.  Patient and family member verbalized understanding agree with plan of care. [JS]    Clinical Course User Index [JS] Irean Hong, MD     ____________________________________________   FINAL CLINICAL IMPRESSION(S) / ED DIAGNOSES  Final diagnoses:  Pain  IVDU (intravenous drug user)  Abscess  Cellulitis of left upper extremity     ED Discharge Orders         Ordered    clindamycin (CLEOCIN) 300 MG capsule  3 times daily        01/20/21 0223          *Please note:  Lazariah Savard Eaves was evaluated in Emergency Department on 01/20/2021 for the symptoms described in the history of present illness. She was evaluated in the  context of the global COVID-19 pandemic, which necessitated consideration that the patient might be at risk for infection with the SARS-CoV-2 virus that causes COVID-19. Institutional protocols and algorithms that pertain to the evaluation of patients at risk for COVID-19 are in a state of rapid change based on information released by regulatory bodies including the CDC and federal and state organizations. These policies and algorithms were followed during the patient's care in the ED.  Some ED evaluations and interventions may be delayed as a result of limited staffing during and the pandemic.*   Note:  This document was prepared using Dragon voice recognition software and may include unintentional dictation errors.   Irean Hong, MD 01/20/21 [phone]

## 2021-01-20 DIAGNOSIS — F1110 Opioid abuse, uncomplicated: Secondary | ICD-10-CM

## 2021-01-20 DIAGNOSIS — L03114 Cellulitis of left upper limb: Secondary | ICD-10-CM

## 2021-01-20 DIAGNOSIS — D649 Anemia, unspecified: Secondary | ICD-10-CM

## 2021-01-20 DIAGNOSIS — L02419 Cutaneous abscess of limb, unspecified: Secondary | ICD-10-CM

## 2021-01-20 LAB — RESP PANEL BY RT-PCR (FLU A&B, COVID) ARPGX2
Influenza A by PCR: NEGATIVE
Influenza B by PCR: NEGATIVE
SARS Coronavirus 2 by RT PCR: NEGATIVE

## 2021-01-20 LAB — IRON AND TIBC
Iron: 14 ug/dL — ABNORMAL LOW (ref 28–170)
Saturation Ratios: 3 % — ABNORMAL LOW (ref 10.4–31.8)
TIBC: 438 ug/dL (ref 250–450)
UIBC: 424 ug/dL

## 2021-01-20 LAB — FOLATE: Folate: 18.6 ng/mL (ref >5.9)

## 2021-01-20 LAB — LACTIC ACID, PLASMA: Lactic Acid, Venous: 1.1 mmol/L (ref 0.5–1.9)

## 2021-01-20 LAB — FERRITIN: Ferritin: 5 ng/mL — ABNORMAL LOW (ref 11–307)

## 2021-01-20 MED ORDER — CLINDAMYCIN HCL 300 MG PO CAPS: 300.0000 mg | ORAL_CAPSULE | Freq: Three times a day (TID) | ORAL | 0 refills | Status: DC

## 2021-01-20 MED ORDER — ACETAMINOPHEN 325 MG PO TABS: 650.0000 mg | ORAL_TABLET | Freq: Four times a day (QID) | ORAL | Status: DC | PRN

## 2021-01-20 MED ORDER — MORPHINE SULFATE (PF) 2 MG/ML IV SOLN: 2.0000 mg | INTRAVENOUS | Status: DC | PRN

## 2021-01-20 MED ORDER — ACETAMINOPHEN 650 MG RE SUPP: 650.0000 mg | Freq: Four times a day (QID) | RECTAL | Status: DC | PRN

## 2021-01-20 MED ORDER — ONDANSETRON HCL 4 MG/2ML IJ SOLN: 4.0000 mg | Freq: Four times a day (QID) | INTRAMUSCULAR | Status: DC | PRN

## 2021-01-20 MED ORDER — VANCOMYCIN HCL 1500 MG/300ML IV SOLN: 1500.0000 mg | Freq: Two times a day (BID) | INTRAVENOUS | Status: DC

## 2021-01-20 MED ORDER — ONDANSETRON HCL 4 MG PO TABS: 4.0000 mg | ORAL_TABLET | Freq: Four times a day (QID) | ORAL | Status: DC | PRN

## 2021-01-20 MED ORDER — SODIUM CHLORIDE 0.9 % IV SOLN: INTRAVENOUS | Status: DC

## 2021-01-20 MED ORDER — HYDROCODONE-ACETAMINOPHEN 5-325 MG PO TABS: 1.0000 | ORAL_TABLET | ORAL | Status: DC | PRN

## 2021-01-20 MED ORDER — VANCOMYCIN HCL IN DEXTROSE 1-5 GM/200ML-% IV SOLN
1000.0000 mg | Freq: Once | INTRAVENOUS | Status: AC
  Administered 2021-01-20: 1000 mg via INTRAVENOUS
  Filled 2021-01-20: qty 200

## 2021-01-20 MED ORDER — KETOROLAC TROMETHAMINE 30 MG/ML IJ SOLN: 30.0000 mg | Freq: Three times a day (TID) | INTRAMUSCULAR | Status: DC | PRN

## 2021-01-20 MED ORDER — VANCOMYCIN HCL IN DEXTROSE 1-5 GM/200ML-% IV SOLN: 1000.0000 mg | Freq: Once | INTRAVENOUS | Status: DC

## 2021-01-20 NOTE — Discharge Instructions (Addendum)
You are leaving AGAINST MEDICAL ADVICE.  Please return to the ER if you change your mind or get worse.  Please take the antibiotic prescribed to you (Clindamycin).  Return sooner for fever, persistent vomiting, difficulty breathing, increased pain/redness/swelling or other concerns.

## 2021-01-20 NOTE — Progress Notes (Signed)
Pharmacy Antibiotic Note  Penny Zimmerman is a 35 y.o. female admitted on 01/19/2021 with IVDA abscess.  Pharmacy has been consulted for Vancomycin dosing.  Plan: Vancomycin 1 gm IV X 1 given in ED on 4/30 @ 2351. Additional Vanc 1 gm IV X 1 given to make total loading dose of 2 gm. Vancomycin 1500 mg IV Q12H ordered to start on 5/1 @ 1200.   AUC = 534.1  Vanc trough = 14.3   Height: 5\' 8"  (172.7 cm) Weight: 88.5 kg (195 lb) IBW/kg (Calculated) : 63.9  Temp (24hrs), Avg:98.1 F (36.7 C), Min:98 F (36.7 C), Max:98.2 F (36.8 C)  Recent Labs  Lab 01/19/21 2112 01/19/21 2353  WBC 8.5  --   CREATININE 0.52  --   LATICACIDVEN  --  1.1    Estimated Creatinine Clearance: 116.4 mL/min (by C-G formula based on SCr of 0.52 mg/dL).    No Known Allergies  Antimicrobials this admission:   >>    >>   Dose adjustments this admission:   Microbiology results:  BCx:  UCx:    Sputum:    MRSA PCR:   Thank you for allowing pharmacy to be a part of this patient's care.  Trini Christiansen D 01/20/2021 12:34 AM

## 2021-01-20 NOTE — ED Notes (Signed)
Writer to take pt to inpt room, pt states 'I am leaving, I dont like that the doctor wont lance it in the ed, I shouldn't have to get admitted for 2 days to have the same treatment, I am going to [facility] where they actually take care of this'

## 2021-01-25 LAB — CULTURE, BLOOD (ROUTINE X 2)
Culture: NO GROWTH
Culture: NO GROWTH

## 2021-07-14 ENCOUNTER — Emergency Department
Admission: EM | Admit: 2021-07-14 | Discharge: 2021-07-17 | Disposition: A | Discharge status: Home / Self Care | Payer: No Typology Code available for payment source | Attending: Emergency Medicine | Admitting: Emergency Medicine

## 2021-07-14 DIAGNOSIS — F1124 Opioid dependence with opioid-induced mood disorder: Secondary | ICD-10-CM | POA: Insufficient documentation

## 2021-07-14 DIAGNOSIS — T50901A Poisoning by unspecified drugs, medicaments and biological substances, accidental (unintentional), initial encounter: Secondary | ICD-10-CM

## 2021-07-14 DIAGNOSIS — F17200 Nicotine dependence, unspecified, uncomplicated: Secondary | ICD-10-CM | POA: Diagnosis not present

## 2021-07-14 DIAGNOSIS — Z20822 Contact with and (suspected) exposure to covid-19: Secondary | ICD-10-CM | POA: Diagnosis not present

## 2021-07-14 DIAGNOSIS — T434X1A Poisoning by butyrophenone and thiothixene neuroleptics, accidental (unintentional), initial encounter: Secondary | ICD-10-CM | POA: Insufficient documentation

## 2021-07-14 DIAGNOSIS — F1524 Other stimulant dependence with stimulant-induced mood disorder: Secondary | ICD-10-CM | POA: Diagnosis not present

## 2021-07-14 DIAGNOSIS — Z79899 Other long term (current) drug therapy: Secondary | ICD-10-CM | POA: Diagnosis not present

## 2021-07-14 LAB — URINALYSIS, ROUTINE W REFLEX MICROSCOPIC
Bacteria, UA: NONE SEEN
Bilirubin Urine: NEGATIVE
Glucose, UA: 50 mg/dL — AB
Ketones, ur: 80 mg/dL — AB
Leukocytes,Ua: NEGATIVE
Nitrite: NEGATIVE
Protein, ur: 30 mg/dL — AB
Specific Gravity, Urine: 1.017 (ref 1.005–1.030)
pH: 5 (ref 5.0–8.0)

## 2021-07-14 LAB — URINE DRUG SCREEN, QUALITATIVE (ARMC ONLY)
Amphetamines, Ur Screen: POSITIVE — AB
Barbiturates, Ur Screen: NOT DETECTED
Benzodiazepine, Ur Scrn: POSITIVE — AB
Cannabinoid 50 Ng, Ur ~~LOC~~: NOT DETECTED
Cocaine Metabolite,Ur ~~LOC~~: POSITIVE — AB
MDMA (Ecstasy)Ur Screen: NOT DETECTED
Methadone Scn, Ur: NOT DETECTED
Opiate, Ur Screen: POSITIVE — AB
Phencyclidine (PCP) Ur S: NOT DETECTED
Tricyclic, Ur Screen: NOT DETECTED

## 2021-07-14 LAB — PREGNANCY, URINE: Preg Test, Ur: NEGATIVE

## 2021-07-14 MED ORDER — DROPERIDOL 2.5 MG/ML IJ SOLN
2.5000 mg | Freq: Once | INTRAMUSCULAR | Status: AC
  Administered 2021-07-14: 2.5 mg via INTRAMUSCULAR

## 2021-07-14 MED ORDER — LORAZEPAM 2 MG/ML IJ SOLN: 1.0000 mg | Freq: Once | INTRAMUSCULAR | Status: AC

## 2021-07-14 MED ORDER — LORAZEPAM 2 MG/ML IJ SOLN
INTRAMUSCULAR | Status: AC
  Administered 2021-07-14: 1 mg via INTRAVENOUS
  Filled 2021-07-14: qty 1

## 2021-07-14 NOTE — ED Notes (Signed)
Pt resting comfortably at this time. Normal rise and fall of chest. Call bell in reach.  

## 2021-07-14 NOTE — ED Notes (Signed)
Pt presents to ED via EMS with c/o of non-intentional overdose on drugs. Pt presents screaming, making no sense and flipping and flopping on the bed. Pt admits she took meth and dose this daily. Pt states today she smoked it but does snort, smoke and also does inject it.   EMS gave 4mg  of intranasal narcan due to pt being slumped over in a car with minimal resp rate. Pt is alert but is still having involuntary movements.

## 2021-07-14 NOTE — ED Provider Notes (Signed)
[facility] Emergency Department Provider Note   ____________________________________________   Event Date/Time   First MD Initiated Contact with Patient 07/14/21 1013     (approximate)  I have reviewed the triage vital signs and the nursing notes.   HISTORY  Chief Complaint Drug Overdose    HPI Penny Zimmerman is a 35 y.o. female who is brought in by EMS.  She says she has been doing meth.  She is awake alert and oriented but having involuntary movements and gagging and yelling and screaming.  She reports no chest pain or shortness of breath or belly pain etc.  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  For repeat postpartum.      Patient Active Problem List   Diagnosis Date Noted   Heroin abuse (HCC) 01/20/2021   Cellulitis of left upper extremity 01/20/2021   Abscess of antecubital fossa 01/20/2021   Cellulitis of left arm 01/20/2021   Anemia 01/20/2021   OCD (obsessive compulsive disorder) 05/21/2018   PTSD (post-traumatic stress disorder) 05/21/2018   Bipolar I disorder, most recent episode depressed, severe without psychotic features (HCC) 05/19/2018   Opioid use disorder, moderate, dependence (HCC) 05/19/2018   Cocaine use disorder, moderate, dependence (HCC) 05/19/2018   Tobacco use disorder 05/19/2018   Cannabis use disorder, moderate, dependence (HCC) 12/04/2015   Depression 10/24/2015   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  Authorizing Provider  carbamazepine (TEGRETOL) 200 MG tablet Take 1 tablet (200 mg total) by mouth 2 (two) times daily at 8 am and 10 pm. 05/24/18   Pucilowska, Jolanta B, MD  clindamycin (CLEOCIN) 300 MG capsule Take 1 capsule (300 mg total) by mouth 3 (three) times daily. 01/20/21   Irean Hong, MD  fluvoxaMINE (LUVOX) 100 MG tablet Take 1 tablet (100 mg total) by mouth at bedtime. 05/24/18   Pucilowska, Braulio Conte B, MD  hydrOXYzine (ATARAX/VISTARIL) 50 MG tablet Take 1 tablet (50 mg total) by mouth 3 (three) times daily as needed for anxiety. 05/24/18   Pucilowska, Braulio Conte B, MD  traZODone (DESYREL) 100 MG tablet Take 1 tablet (100 mg total) by mouth at bedtime. 05/24/18   Pucilowska, Ellin Goodie, MD    Allergies Patient has no known allergies.  Family History  Problem Relation Age of Onset   Diabetes Father    Hypertension Father    Hyperlipidemia Father    Diabetes Paternal Grandmother     Social History Social History   Tobacco Use   Smoking status: Some Days    Packs/day: 0.50    Years: 10.00    Pack years: 5.00    Types: Cigarettes   Smokeless tobacco: Never  Substance Use Topics   Alcohol use: No    Alcohol/week: 0.0 standard drinks   Drug use: No    Review of Systems  Unable to obtain due to patient's reaction to the meth he is unable to say more than 1 word at a time.  ____________________________________________   PHYSICAL EXAM:  VITAL SIGNS: ED Triage Vitals  Enc Vitals Group     BP --      Pulse Rate 07/14/21 0957 (!) 119     Resp 07/14/21 0959 18     Temp --      Temp src --      SpO2 --      Weight --      Height --      Head Circumference --      Peak Flow --      Pain Score --      Pain Loc --      Pain Edu? --      Excl. in GC? --     Constitutional: Alert and oriented.  Yelling screaming gagging and having spasms Eyes: Conjunctivae are normal. PERRL. EOMI. Head: Atraumatic. Nose: No congestion/rhinnorhea. Mouth/Throat: Mucous membranes are  moist.  Oropharynx non-erythematous. Neck: No stridor.  Cardiovascular: Normal rate, regular rhythm. Grossly normal heart sounds.  Good peripheral circulation. Respiratory: Normal respiratory effort.  No retractions. Lungs CTAB. Gastrointestinal: Soft and nontender. No distention. No abdominal bruits.  Musculoskeletal: No lower extremity tenderness nor edema.   Neurologic:  Normal speech and language. No gross focal neurologic deficits are appreciated.  Moving all extremities equally and well Skin:  Skin is warm, dry and intact. No rash noted.   ____________________________________________   LABS (all labs ordered are listed, but only abnormal results are displayed)  Labs Reviewed  COMPREHENSIVE METABOLIC PANEL  CBC WITH DIFFERENTIAL/PLATELET  PREGNANCY, URINE  URINALYSIS, ROUTINE W REFLEX MICROSCOPIC  URINE DRUG SCREEN, QUALITATIVE ([facility] ONLY)   ____________________________________________  EKG  EKG read interpreted by me shows sinus tachycardia rate of 115 normal axis no acute ST-T changes QRS is being read as prolonged at 123 ms QTC is only 478 ms ____________________________________________  RADIOLOGY Jill Poling, personally viewed and evaluated these images (plain radiographs) as part of my medical decision making, as well as reviewing the written report by the radiologist.  ED MD interpretation:    Official radiology report(s): No results found.  ____________________________________________   PROCEDURES  Procedure(s) performed (including Critical Care):  Procedures   ____________________________________________   INITIAL IMPRESSION / ASSESSMENT AND PLAN / ED COURSE  Patient asking to be knocked out.  Her behavior is such that she will need to be knocked out or she will fall off the stretcher or otherwise injure herself or others.  I gave her 2-1/2 mg of droperidol IM this did nothing so after about 7 minutes I give her another 2-1/2 mg IM and after 10  more minutes still nothing occurred thrive ordered 2 mg of Ativan IM.    ----------------------------------------- 3:44 PM on 07/14/2021 ----------------------------------------- Patient is sleeping comfortably.  Good O2 sats and stable vital signs.  We will let her sleep.  I am going to sign her out we will have to check and make sure she was not suicidal when she wakes up.         ____________________________________________   FINAL CLINICAL IMPRESSION(S) / ED DIAGNOSES  Final diagnoses:  Accidental drug overdose, initial encounter     ED Discharge Orders     None        Note:  This document was prepared using Dragon voice recognition software and may include unintentional dictation errors.    Arnaldo Natal, MD 07/14/21 [phone]

## 2021-07-14 NOTE — ED Notes (Signed)
Pt calmer at this time, EKG obtained and BP.

## 2021-07-14 NOTE — BH Assessment (Signed)
Comprehensive Clinical Assessment (CCA) Note  07/14/2021 Penny Zimmerman [id number]  Chief Complaint: Patient is a 35 year old female presenting to [facility] ED voluntarily with her mother. Per triage note Pt presents to ED via EMS with c/o of non-intentional overdose on drugs. Pt admits she took meth and dose this daily. Pt states today she smoked it but does snort, smoke and also does inject it. During assessment patient appears oriented x4 but still under the influence. Patient reports that uses both Heroin and Methamphetamine. Patient reports that she uses Meth "daily" isn't able to report the amounts she uses and reports that uses via inhaling. Patient also reports Heroin use but isn't able to report the amounts she uses or how often, she is able to report that she uses heroin via inhaling. Patient also reports that she drinks alcohol. Patient UDS is positive for Amphetamines, Cocaine, Opiates, and Benzos. Patient's mother reports that patient has a advocate who has long term substance abuse treatment set up for patient but patient has to be admitted to a detox facility first before going to long term treatment. Patient denies current SI/HI/AH/VH and does not appear to be responding to any internal or external stimuli. Chief Complaint  Patient presents with   Drug Overdose   Visit Diagnosis: Opioid Use Disorder, moderate by hx. Stimulant Use Disorder-Amphetamine type severe   CCA Screening, Triage and Referral (STR)  Patient Reported Information How did you hear about [place]? Family/Friend  Referral name: No data recorded Referral phone number: No data recorded  Whom do you see for routine medical problems? No data recorded Practice/Facility Name: No data recorded Practice/Facility Phone Number: No data recorded Name of Contact: No data recorded Contact Number: No data recorded Contact Fax Number: No data recorded Prescriber Name: No data recorded Prescriber Address (if known): No data  recorded  What Is the Reason for Your Visit/Call Today? Patient presents here with her mother requesting detox tratment  How Long Has This Been Causing You Problems? > than 6 months  What Do You Feel Would Help You the Most Today? Alcohol or Drug Use Treatment   Have You Recently Been in Any Inpatient Treatment (Hospital/Detox/Crisis Center/28-Day Program)? No data recorded Name/Location of Program/Hospital:No data recorded How Long Were You There? No data recorded When Were You Discharged? No data recorded  Have You Ever Received Services From [facility] Before? No data recorded Who Do You See at [facility]? No data recorded  Have You Recently Had Any Thoughts About Hurting Yourself? No  Are You Planning to Commit Suicide/Harm Yourself At This time? No   Have you Recently Had Thoughts About Hurting Someone Karolee Ohs? No  Explanation: No data recorded  Have You Used Any Alcohol or Drugs in the Past 24 Hours? No  How Long Ago Did You Use Drugs or Alcohol? No data recorded What Did You Use and How Much? No data recorded  Do You Currently Have a Therapist/Psychiatrist? No  Name of Therapist/Psychiatrist: No data recorded  Have You Been Recently Discharged From Any Office Practice or Programs? No  Explanation of Discharge From Practice/Program: No data recorded    CCA Screening Triage Referral Assessment Type of Contact: Face-to-Face  Is this Initial or Reassessment? No data recorded Date Telepsych consult ordered in [facility]:  No data recorded Time Telepsych consult ordered in [facility]:  No data recorded  Patient Reported Information Reviewed? No data recorded Patient Left Without Being Seen? No data recorded Reason for Not Completing Assessment: No data  recorded  Collateral Involvement: No data recorded  Does Patient Have a Court Appointed Legal Guardian? No data recorded Name and Contact of Legal Guardian: No data recorded If Minor and Not Living with Parent(s), Who has  Custody? No data recorded Is CPS involved or ever been involved? Never  Is APS involved or ever been involved? Never   Patient Determined To Be At Risk for Harm To Self or Others Based on Review of Patient Reported Information or Presenting Complaint? No  Method: No data recorded Availability of Means: No data recorded Intent: No data recorded Notification Required: No data recorded Additional Information for Danger to Others Potential: No data recorded Additional Comments for Danger to Others Potential: No data recorded Are There Guns or Other Weapons in Your Home? No data recorded Types of Guns/Weapons: No data recorded Are These Weapons Safely Secured?                            No data recorded Who Could Verify You Are Able To Have These Secured: No data recorded Do You Have any Outstanding Charges, Pending Court Dates, Parole/Probation? No data recorded Contacted To Inform of Risk of Harm To Self or Others: No data recorded  Location of Assessment: [facility] ED   Does Patient Present under Involuntary Commitment? No  IVC Papers Initial File Date: No data recorded  [place] of Residence: [place]   Patient Currently Receiving the Following Services: No data recorded  Determination of Need: Emergent (2 hours)   Options For Referral: No data recorded    CCA Biopsychosocial Intake/Chief Complaint:  No data recorded Current Symptoms/Problems: No data recorded  Patient Reported Schizophrenia/Schizoaffective Diagnosis in Past: No   Strengths: Patient is able to communicate  Preferences: No data recorded Abilities: No data recorded  Type of Services Patient Feels are Needed: No data recorded  Initial Clinical Notes/Concerns: No data recorded  Mental Health Symptoms Depression:   Change in energy/activity; Sleep (too much or little)   Duration of Depressive symptoms: No data recorded  Mania:   None   Anxiety:    None   Psychosis:   None   Duration of  Psychotic symptoms: No data recorded  Trauma:   None   Obsessions:   None   Compulsions:   None   Inattention:   None   Hyperactivity/Impulsivity:   None   Oppositional/Defiant Behaviors:   None   Emotional Irregularity:   None   Other Mood/Personality Symptoms:  No data recorded   Mental Status Exam Appearance and self-care  Stature:   Average   Weight:   Average weight   Clothing:   Disheveled   Grooming:   Neglected   Cosmetic use:   None   Posture/gait:   Normal   Motor activity:   Not Remarkable   Sensorium  Attention:   Inattentive   Concentration:   Normal   Orientation:   X5   Recall/memory:   Normal   Affect and Mood  Affect:   Depressed   Mood:   Depressed   Relating  Eye contact:   Avoided   Facial expression:   Depressed   Attitude toward examiner:   Cooperative   Thought and Language  Speech flow:  Clear and Coherent; Soft   Thought content:   Appropriate to Mood and Circumstances   Preoccupation:   None   Hallucinations:   None   Organization:  No data recorded  [employer]  Fund of Knowledge:   Fair   Intelligence:   Average   Abstraction:   Functional   Judgement:   Impaired   Dance movement psychotherapist:   Unaware   Insight:   Lacking   Decision Making:   Impulsive   Social Functioning  Social Maturity:   Irresponsible   Social Judgement:   "Chief of Staff"   Stress  Stressors:   Family conflict   Coping Ability:   Contractor Deficits:   None   Supports:   Family     Religion: Religion/Spirituality Are You A Religious Person?: No  Leisure/Recreation: Leisure / Recreation Do You Have Hobbies?: No  Exercise/Diet: Exercise/Diet Do You Exercise?: No Have You Gained or Lost A Significant Amount of Weight in the Past Six Months?: No Do You Follow a Special Diet?: No Do You Have Any Trouble Sleeping?: Yes Explanation of Sleeping Difficulties: Patient reports  lack of sleep   CCA Employment/Education Employment/Work Situation: Employment / Work Situation Employment Situation: Unemployed Has Patient ever Been in Equities trader?: No  Education: Education Did Theme park manager?: No Did You Have An Individualized Education Program (IIEP): No Did You Have Any Difficulty At [employer]?: No Patient's Education Has Been Impacted by Current Illness: No   CCA Family/Childhood History Family and Relationship History: Family history Marital status: Single Does patient have children?: Yes How many children?: 2 How is patient's relationship with their children?: Patient's mother has custody of children  Childhood History:  Childhood History By whom was/is the patient raised?: Mother Did patient suffer any verbal/emotional/physical/sexual abuse as a child?: No Did patient suffer from severe childhood neglect?: No Has patient ever been sexually abused/assaulted/raped as an adolescent or adult?: No Was the patient ever a victim of a crime or a disaster?: No Witnessed domestic violence?: No Has patient been affected by domestic violence as an adult?: No  Child/Adolescent Assessment:     CCA Substance Use Alcohol/Drug Use: Alcohol / Drug Use Pain Medications: See MAR Prescriptions: See MAR Over the Counter: See MAR History of alcohol / drug use?: Yes Substance #1 Name of Substance 1: Methamphetamine 1 - Age of First Use: 11 1 - Amount (size/oz): Unknown 1 - Frequency: daily 1 - Duration: 20 years 1 - Last Use / Amount: 07/14/21 1- Route of Use: Inhalation Substance #2 Name of Substance 2: Heroin 2 - Age of First Use: 11 2 - Amount (size/oz): Unknown 2 - Frequency: Unknown 2 - Last Use / Amount: 07/14/21 2 - Route of Substance Use: Inhalation Substance #3 Name of Substance 3: Alcohol 3 - Age of First Use: Unkonwn 3 - Amount (size/oz): Unknown 3 - Frequency: Unkown                   ASAM's:  Six Dimensions of  Multidimensional Assessment  Dimension 1:  Acute Intoxication and/or Withdrawal Potential:      Dimension 2:  Biomedical Conditions and Complications:      Dimension 3:  Emotional, Behavioral, or Cognitive Conditions and Complications:     Dimension 4:  Readiness to Change:     Dimension 5:  Relapse, Continued use, or Continued Problem Potential:     Dimension 6:  Recovery/Living Environment:     ASAM Severity Score:    ASAM Recommended Level of Treatment: ASAM Recommended Level of Treatment: Level III Residential Treatment   Substance use Disorder (SUD) Substance Use Disorder (SUD)  Checklist Symptoms of Substance Use: Continued use despite having a persistent/recurrent physical/psychological problem  caused/exacerbated by use, Evidence of tolerance, Evidence of withdrawal (Comment), Large amounts of time spent to obtain, use or recover from the substance(s), Continued use despite persistent or recurrent social, interpersonal problems, caused or exacerbated by use, Persistent desire or unsuccessful efforts to cut down or control use, Presence of craving or strong urge to use, Social, occupational, recreational activities given up or reduced due to use, Recurrent use that results in a failure to fulfill major role obligations (work, school, home), Substance(s) often taken in larger amounts or over longer times than was intended, Repeated use in physically hazardous situations  Recommendations for Services/Supports/Treatments: Recommendations for Services/Supports/Treatments Recommendations For Services/Supports/Treatments: Detox  DSM5 Diagnoses: Patient Active Problem List   Diagnosis Date Noted   Heroin abuse (HCC) 01/20/2021   Cellulitis of left upper extremity 01/20/2021   Abscess of antecubital fossa 01/20/2021   Cellulitis of left arm 01/20/2021   Anemia 01/20/2021   OCD (obsessive compulsive disorder) 05/21/2018   PTSD (post-traumatic stress disorder) 05/21/2018   Bipolar I  disorder, most recent episode depressed, severe without psychotic features (HCC) 05/19/2018   Opioid use disorder, moderate, dependence (HCC) 05/19/2018   Cocaine use disorder, moderate, dependence (HCC) 05/19/2018   Tobacco use disorder 05/19/2018   Cannabis use disorder, moderate, dependence (HCC) 12/04/2015   Depression 10/24/2015   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

## 2021-07-14 NOTE — ED Notes (Signed)
Pts. Preg. Was NEGATIVE.

## 2021-07-14 NOTE — ED Notes (Signed)
Pt resting comfortably at this time. Normal rise and fall of chest. VSS. Pt in view of this RN.

## 2021-07-14 NOTE — Discharge Instructions (Addendum)
Please seek medical attention for any high fevers, chest pain, shortness of breath, change in behavior, persistent vomiting, bloody stool or any other new or concerning symptoms.  

## 2021-07-14 NOTE — ED Notes (Signed)
Pt resting in bed No verbalized needs at this time

## 2021-07-14 NOTE — ED Notes (Signed)
Pt mother informed DAWN Rn that PT would like to be admitted for detox at this time. MD aware.

## 2021-07-14 NOTE — ED Notes (Signed)
Pt mother left her information if we need to call. Dulcy Fanny- [phone]

## 2021-07-14 NOTE — ED Provider Notes (Signed)
Procedures     ----------------------------------------- 6:03 PM on 07/14/2021 ----------------------------------------- Patient is awake and alert, oriented.  Tolerating p.o., steady gait, clear speech.  Denies SI HI or hallucinations.  This was a recreational misadventure and not an attempt at intentional self-harm.  She is stable for discharge home, has medical decision-making capacity and clinically sober.     Sharman Cheek, MD 07/14/21 [phone]

## 2021-07-14 NOTE — ED Notes (Signed)
Pt given water and [place] tray, mother at bedside

## 2021-07-14 NOTE — BH Assessment (Addendum)
Referral information for Detox treatment faxed to;   [facility] [phone] -or- [phone]), Durenda Age reports denied due to Meth and Heroin use, facility only accepts patients that use Alcohol and Benzos  Additional facilities faxed to at 10:32pm at 07/14/21  [facility] [phone]) Staff reports no beds available tonight, check back in the morning 07/15/21  [facility] [phone]) Bri reports denied due to out-of-network Medicaid    [facility] unable to accept patient due to positive for Amphetamines

## 2021-07-14 NOTE — ED Notes (Signed)
Pt's behavior to erratic at this time to obtain a accurate BP and also an accurate EKG, MD aware. Pt given IM medications to calm pt down, will obtain this when capable, MD agrees.

## 2021-07-14 NOTE — ED Notes (Addendum)
PT BELONGINGS LABELED AND AT QUAD RN STATION (holding until decisions made about plan) Bag of clothes, pocketbook, cell phone  Pt stated she did not feel safe just being discharged. Dr. Demetrius Charity said it was ok to stay. Pt phoned mother and pt stated mother was coming to hospital.   Pt stated she had plan to go to rehab today/tomorrow. She stated she just got out of 4 month jail stay. Does not need detox-just rehab plan. Looking for support in plan to get to rehab, support and plan in staying sober.  Pt states she did not know guy she was with very well. She usually uses "ice" daily before jail stay, states today she did cocaine and heroin possibly. Based  off her physical body movements upon arrival, drug screen can determine what was actually used possibly.

## 2021-07-14 NOTE — ED Notes (Signed)
This RN spoke with the EMS crew that brought pt in this morning advised she was found in a car at [place] on the corner of [address]. The pt had requested to know where she was found. EMS also advised she was topless and was in a car with a man.

## 2021-07-15 LAB — COMPREHENSIVE METABOLIC PANEL
ALT: 147 U/L — ABNORMAL HIGH (ref 0–44)
AST: 201 U/L — ABNORMAL HIGH (ref 15–41)
Albumin: 3.2 g/dL — ABNORMAL LOW (ref 3.5–5.0)
Alkaline Phosphatase: 69 U/L (ref 38–126)
Anion gap: 9 (ref 5–15)
BUN: 10 mg/dL (ref 6–20)
CO2: 25 mmol/L (ref 22–32)
Calcium: 8.6 mg/dL — ABNORMAL LOW (ref 8.9–10.3)
Chloride: 103 mmol/L (ref 98–111)
Creatinine, Ser: 0.62 mg/dL (ref 0.44–1.00)
GFR, Estimated: >60 mL/min (ref >60)
Glucose, Bld: 81 mg/dL (ref 70–99)
Potassium: 3.7 mmol/L (ref 3.5–5.1)
Sodium: 137 mmol/L (ref 135–145)
Total Bilirubin: 1 mg/dL (ref 0.3–1.2)
Total Protein: 6.7 g/dL (ref 6.5–8.1)

## 2021-07-15 LAB — CBC WITH DIFFERENTIAL/PLATELET
Abs Immature Granulocytes: 0.07 10*3/uL (ref 0.00–0.07)
Basophils Absolute: 0 10*3/uL (ref 0.0–0.1)
Basophils Relative: 0 %
Eosinophils Absolute: 0.1 10*3/uL (ref 0.0–0.5)
Eosinophils Relative: 1 %
HCT: 35.2 % — ABNORMAL LOW (ref 36.0–46.0)
Hemoglobin: 11.5 g/dL — ABNORMAL LOW (ref 12.0–15.0)
Immature Granulocytes: 1 %
Lymphocytes Relative: 21 %
Lymphs Abs: 2.5 10*3/uL (ref 0.7–4.0)
MCH: 25.9 pg — ABNORMAL LOW (ref 26.0–34.0)
MCHC: 32.7 g/dL (ref 30.0–36.0)
MCV: 79.3 fL — ABNORMAL LOW (ref 80.0–100.0)
Monocytes Absolute: 0.8 10*3/uL (ref 0.1–1.0)
Monocytes Relative: 7 %
Neutro Abs: 8.6 10*3/uL — ABNORMAL HIGH (ref 1.7–7.7)
Neutrophils Relative %: 70 %
Platelets: 259 10*3/uL (ref 150–400)
RBC: 4.44 MIL/uL (ref 3.87–5.11)
RDW: 19.5 % — ABNORMAL HIGH (ref 11.5–15.5)
WBC: 12.1 10*3/uL — ABNORMAL HIGH (ref 4.0–10.5)
nRBC: 0 % (ref 0.0–0.2)

## 2021-07-15 LAB — RESP PANEL BY RT-PCR (FLU A&B, COVID) ARPGX2
Influenza A by PCR: NEGATIVE
Influenza B by PCR: NEGATIVE
SARS Coronavirus 2 by RT PCR: NEGATIVE

## 2021-07-15 NOTE — ED Notes (Signed)

## 2021-07-15 NOTE — ED Notes (Signed)
Hospital meal provided.  100% consumed, pt tolerated w/o complaints.  Waste discarded appropriately.   

## 2021-07-15 NOTE — BH Assessment (Signed)
[facility] does not accept patients with current Amphetamines in their system. [facility] will have a female bed available on tomm 07/16/21.

## 2021-07-15 NOTE — ED Notes (Signed)
VOL  PENDING  PLACEMENT 

## 2021-07-15 NOTE — ED Notes (Signed)
Pt. To BHU from ED ambulatory without difficulty, to room  BHU 2. Report from [facility]. Pt. Is alert and oriented, warm and dry in no distress. Pt. Denies SI, HI, and AVH. Pt. Calm and cooperative. Pt. Made aware of security cameras and Q15 minute rounds. Pt. Encouraged to let Nursing staff know of any concerns or needs.

## 2021-07-15 NOTE — ED Notes (Signed)
VOL/pending placement 

## 2021-07-15 NOTE — ED Notes (Signed)
Pt gently woken and given food tray and drink by NT.

## 2021-07-15 NOTE — BH Assessment (Signed)
Writer informed patient of current barriers regarding admission. Patient reports she is willing to wait and hopes she is accepted soon.

## 2021-07-15 NOTE — ED Notes (Signed)
Spoke with [facility] on the phone.

## 2021-07-15 NOTE — ED Notes (Addendum)
Pt given new pants and underwear as she started her menstrual period.  Requesting for update. Called [facility] and requested to come update patient, states that they will. Pt reports that if unable to find detox facility that she wants to go home.

## 2021-07-15 NOTE — ED Notes (Signed)
Pt. Got snacks and a sandwich tray.

## 2021-07-15 NOTE — BH Assessment (Addendum)
Referral checks;    [facility] [phone] -or- [phone]), Durenda Age reports denied due to Meth and Heroin use, facility only accepts patients that use Alcohol and Benzos   Additional facilities faxed to at 10:32pm at 07/14/21   [facility] [phone]) Contacted facility back for the 3rd time, staff now report that they did not receive referral after faxing it 2 times so far. 3rd attempt was made to fax referral. Task completed at 07/16/21 2:08am   [facility] [phone]) Bri reports denied due to out-of-network Medicaid       [facility] unable to accept patient due to positive for Amphetamines

## 2021-07-15 NOTE — ED Notes (Signed)
Meal given to pt.

## 2021-07-15 NOTE — ED Notes (Signed)
Using phone. 

## 2021-07-16 MED ORDER — ACETAMINOPHEN 325 MG PO TABS
650.0000 mg | ORAL_TABLET | Freq: Four times a day (QID) | ORAL | Status: DC | PRN
  Administered 2021-07-16 – 2021-07-17 (×2): 650 mg via ORAL
  Filled 2021-07-16 (×2): qty 2

## 2021-07-16 NOTE — ED Provider Notes (Signed)
Emergency Medicine Observation Re-evaluation Note  Penny Zimmerman is a 35 y.o. female, seen on rounds today.  Pt initially presented to the ED for complaints of Drug Overdose Currently, the patient is calm, resting.  Physical Exam  BP 107/72 (BP Location: Right Arm)   Pulse 82   Temp 98.1 F (36.7 C) (Oral)   Resp 18   SpO2 100%   ED Course / MDM  EKG:EKG Interpretation  Date/Time:  Sunday July 14 2021 10:39:00 EDT Ventricular Rate:  115 PR Interval:  115 QRS Duration: 123 QT Interval:  345 QTC Calculation: 478 R Axis:   71 Text Interpretation: Sinus tachycardia Right atrial enlargement Nonspecific intraventricular conduction delay ----------unconfirmed---------- Confirmed by OVERREAD, NOT ([id number]), editor Enid Derry [phone]) on 07/15/2021 3:55:25 PM  I have reviewed the labs performed to date as well as medications administered while in observation.  Recent changes in the last 24 hours include none.  Plan  Current plan is for psychiatric disposition.  Penny Zimmerman is not under involuntary commitment.     Shaune Pollack, MD 07/16/21 [phone]

## 2021-07-16 NOTE — ED Notes (Signed)
VOL  PENDING  PLACEMENT 

## 2021-07-16 NOTE — ED Notes (Signed)
Pt given snack. 

## 2021-07-16 NOTE — BH Assessment (Signed)
Writer spoke with Foy Guadalajara (jail [phone], who reports she is familiar with patient and has arranged for patient to be admitted at [facility]. Christa states she will get details on patient's arrival and call back with transportation information.

## 2021-07-16 NOTE — ED Notes (Signed)
VOL/Pending Placement 

## 2021-07-16 NOTE — BH Assessment (Signed)
This Clinical research associate attempted to contact Penny Zimmerman (jail [phone] for an update regarding [facility] placement but no answer, a HIPAA compliant voicemail was left to return phone call

## 2021-07-16 NOTE — ED Notes (Signed)
Patient on the phone at this time 

## 2021-07-16 NOTE — ED Notes (Signed)
Food tray with water was given. 

## 2021-07-17 NOTE — BH Assessment (Signed)
Writer left message on voicemail for Penny Zimmerman to return call. [phone]

## 2021-07-17 NOTE — ED Notes (Signed)
Unable to obtain vitals due to patient sleeping. Will continue to monitor.   

## 2021-07-17 NOTE — ED Notes (Signed)
Pt discharging home. Discharge teaching done and pt verbalized understanding. Given all her personal belongings. A&O x4, ambulatory with steady gait and in stable condition. Awaiting her ride and will escorted her to lobby when the ride gets here.

## 2021-07-17 NOTE — ED Notes (Signed)

## 2021-07-17 NOTE — ED Provider Notes (Signed)
Patient requesting discharge home.   Phineas Semen, MD 07/17/21 [phone]

## 2021-09-25 ENCOUNTER — Ambulatory Visit: Payer: Medicaid Other

## 2021-10-06 DIAGNOSIS — R1010 Upper abdominal pain, unspecified: Secondary | ICD-10-CM | POA: Diagnosis present

## 2021-10-06 DIAGNOSIS — K8020 Calculus of gallbladder without cholecystitis without obstruction: Secondary | ICD-10-CM | POA: Insufficient documentation

## 2021-10-07 ENCOUNTER — Emergency Department: Payer: Medicaid Other

## 2021-10-07 ENCOUNTER — Other Ambulatory Visit: Payer: Self-pay

## 2021-10-07 ENCOUNTER — Emergency Department
Admission: EM | Admit: 2021-10-07 | Discharge: 2021-10-07 | Disposition: A | Discharge status: Home / Self Care | Payer: Medicaid Other | Attending: Emergency Medicine | Admitting: Emergency Medicine

## 2021-10-07 ENCOUNTER — Encounter: Payer: Self-pay | Admitting: Emergency Medicine

## 2021-10-07 DIAGNOSIS — R1084 Generalized abdominal pain: Secondary | ICD-10-CM

## 2021-10-07 DIAGNOSIS — K8020 Calculus of gallbladder without cholecystitis without obstruction: Secondary | ICD-10-CM

## 2021-10-07 LAB — CBC
HCT: 32.7 % — ABNORMAL LOW (ref 36.0–46.0)
Hemoglobin: 10.5 g/dL — ABNORMAL LOW (ref 12.0–15.0)
MCH: 25.3 pg — ABNORMAL LOW (ref 26.0–34.0)
MCHC: 32.1 g/dL (ref 30.0–36.0)
MCV: 78.8 fL — ABNORMAL LOW (ref 80.0–100.0)
Platelets: 249 10*3/uL (ref 150–400)
RBC: 4.15 MIL/uL (ref 3.87–5.11)
RDW: 14.9 % (ref 11.5–15.5)
WBC: 7.3 10*3/uL (ref 4.0–10.5)
nRBC: 0 % (ref 0.0–0.2)

## 2021-10-07 LAB — COMPREHENSIVE METABOLIC PANEL
ALT: 45 U/L — ABNORMAL HIGH (ref 0–44)
AST: 59 U/L — ABNORMAL HIGH (ref 15–41)
Albumin: 3.7 g/dL (ref 3.5–5.0)
Alkaline Phosphatase: 70 U/L (ref 38–126)
Anion gap: 5 (ref 5–15)
BUN: 9 mg/dL (ref 6–20)
CO2: 26 mmol/L (ref 22–32)
Calcium: 9.1 mg/dL (ref 8.9–10.3)
Chloride: 105 mmol/L (ref 98–111)
Creatinine, Ser: 0.53 mg/dL (ref 0.44–1.00)
GFR, Estimated: >60 mL/min (ref >60)
Glucose, Bld: 100 mg/dL — ABNORMAL HIGH (ref 70–99)
Potassium: 4.1 mmol/L (ref 3.5–5.1)
Sodium: 136 mmol/L (ref 135–145)
Total Bilirubin: 0.5 mg/dL (ref 0.3–1.2)
Total Protein: 6.6 g/dL (ref 6.5–8.1)

## 2021-10-07 LAB — URINALYSIS, MICROSCOPIC (REFLEX)

## 2021-10-07 LAB — URINALYSIS, ROUTINE W REFLEX MICROSCOPIC
Bilirubin Urine: NEGATIVE
Glucose, UA: NEGATIVE mg/dL
Hgb urine dipstick: NEGATIVE
Ketones, ur: NEGATIVE mg/dL
Nitrite: NEGATIVE
Protein, ur: NEGATIVE mg/dL
Specific Gravity, Urine: 1.025 (ref 1.005–1.030)
pH: 5.5 (ref 5.0–8.0)

## 2021-10-07 LAB — LIPASE, BLOOD: Lipase: 33 U/L (ref 11–51)

## 2021-10-07 LAB — PREGNANCY, URINE: Preg Test, Ur: NEGATIVE

## 2021-10-07 MED ORDER — ONDANSETRON 4 MG PO TBDP: 4.0000 mg | ORAL_TABLET | Freq: Four times a day (QID) | ORAL | 0 refills | Status: DC | PRN

## 2021-10-07 MED ORDER — DICYCLOMINE HCL 20 MG PO TABS: 20.0000 mg | ORAL_TABLET | Freq: Three times a day (TID) | ORAL | 0 refills | Status: DC | PRN

## 2021-10-07 MED ORDER — IOHEXOL 300 MG/ML  SOLN
100.0000 mL | Freq: Once | INTRAMUSCULAR | Status: AC | PRN
  Administered 2021-10-07: 100 mL via INTRAVENOUS
  Filled 2021-10-07: qty 100

## 2021-10-07 MED ORDER — ONDANSETRON HCL 4 MG/2ML IJ SOLN
4.0000 mg | Freq: Once | INTRAMUSCULAR | Status: AC
  Administered 2021-10-07: 4 mg via INTRAVENOUS
  Filled 2021-10-07: qty 2

## 2021-10-07 MED ORDER — SODIUM CHLORIDE 0.9 % IV BOLUS (SEPSIS)
1000.0000 mL | Freq: Once | INTRAVENOUS | Status: AC
  Administered 2021-10-07: 1000 mL via INTRAVENOUS

## 2021-10-07 MED ORDER — KETOROLAC TROMETHAMINE 30 MG/ML IJ SOLN
30.0000 mg | Freq: Once | INTRAMUSCULAR | Status: AC
  Administered 2021-10-07: 30 mg via INTRAVENOUS
  Filled 2021-10-07: qty 1

## 2021-10-07 NOTE — ED Triage Notes (Addendum)
Pt c/o generalized abdominal pain and lower back pain. Denies any urinary symptoms or vomiting. Last heroin use was 10/06/21

## 2021-10-07 NOTE — ED Provider Notes (Addendum)
[facility] Provider Note    Event Date/Time   First MD Initiated Contact with Patient 10/07/21 0259     (approximate)   History   Abdominal Pain   HPI  Penny Zimmerman is a 36 y.o. female with history of depression, IV heroin abuse who presents to the emergency department complaints of abdominal pain.  States abdominal pain started suddenly tonight started in her upper abdomen and then moved diffusely.  She has had nausea but no vomiting.  No diarrhea.  No dysuria, hematuria, vaginal bleeding or discharge.  No known fevers.  No aggravating or alleviating factors.  No previous abdominal surgery.   History provided by patient and significant other.      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  For repeat postpartum.      Past Surgical History:  Procedure Laterality Date   NO PAST SURGERIES     TUBAL LIGATION Bilateral 12/14/2015   Procedure: POST PARTUM TUBAL LIGATION;  Surgeon: Rubie Maid, MD;  Location: [facility] ORS;  Service: Gynecology;  Laterality: Bilateral;    MEDICATIONS:  Prior to Admission medications   Medication Sig Start Date End Date Taking? Authorizing Provider  carbamazepine (TEGRETOL) 200 MG tablet Take 1 tablet (200 mg total) by mouth 2 (two) times daily at 8 am and 10 pm. Patient not taking: Reported on 07/14/2021 05/24/18   Pucilowska, Wardell Honour, MD  clindamycin (CLEOCIN) 300 MG capsule Take 1 capsule (300 mg total) by mouth 3 (three) times daily. Patient not taking: Reported on 07/14/2021 01/20/21   Paulette Blanch, MD  fluvoxaMINE (LUVOX) 100 MG tablet Take 1 tablet (100 mg total) by mouth at bedtime. Patient not taking: Reported on 07/14/2021 05/24/18   Pucilowska, Herma Ard B, MD  hydrOXYzine (ATARAX/VISTARIL) 50 MG tablet Take 1 tablet (50 mg total) by mouth 3 (three) times daily as needed for anxiety. Patient not taking:  Reported on 07/14/2021 05/24/18   Pucilowska, Wardell Honour, MD  traZODone (DESYREL) 100 MG tablet Take 1 tablet (100 mg total) by mouth at bedtime. Patient not taking: Reported on 07/14/2021 05/24/18   Clovis Fredrickson, MD    Physical Exam   Triage Vital Signs: ED Triage Vitals  Enc Vitals Group     BP 10/07/21 0021 132/68     Pulse Rate 10/07/21 0021 93     Resp 10/07/21 0021 20     Temp 10/07/21 0021 98.3 F (36.8 C)     Temp Source 10/07/21 0021 Oral     SpO2 10/07/21 0021 97 %     Weight 10/07/21 0024 180 lb (81.6 kg)     Height 10/07/21 0024 5\' 9"  (1.753 m)     Head Circumference --      Peak Flow --      Pain Score 10/07/21 0024 8     Pain Loc --      Pain Edu? --      Excl. in [place]? --     Most recent vital signs: Vitals:   10/07/21 0021 10/07/21 0446  BP: 132/68 (!) 106/53  Pulse: 93 76  Resp: 20 16  Temp: 98.3 F (36.8 C)   SpO2: 97% 100%    CONSTITUTIONAL: Alert and oriented and responds appropriately to questions. Well-appearing; well-nourished HEAD: Normocephalic, atraumatic EYES: Conjunctivae clear, pupils appear equal, sclera nonicteric ENT: normal nose; moist mucous membranes NECK: Supple, normal  ROM CARD: RRR; S1 and S2 appreciated; no murmurs, no clicks, no rubs, no gallops RESP: Normal chest excursion without splinting or tachypnea; breath sounds clear and equal bilaterally; no wheezes, no rhonchi, no rales, no hypoxia or respiratory distress, speaking full sentences ABD/GI: Normal bowel sounds; non-distended; soft, ender to palpation throughout the abdomen without guarding or rebound BACK: The back appears normal EXT: Normal ROM in all joints; no deformity noted, no edema; no cyanosis SKIN: Normal color for age and race; warm; no rash on exposed skin NEURO: Moves all extremities equally, normal speech PSYCH: The patient's mood and manner are appropriate.   ED Results / Procedures / Treatments   LABS: (all labs ordered are listed, but only  abnormal results are displayed) Labs Reviewed  COMPREHENSIVE METABOLIC PANEL - Abnormal; Notable for the following components:      Result Value   Glucose, Bld 100 (*)    AST 59 (*)    ALT 45 (*)    All other components within normal limits  CBC - Abnormal; Notable for the following components:   Hemoglobin 10.5 (*)    HCT 32.7 (*)    MCV 78.8 (*)    MCH 25.3 (*)    All other components within normal limits  URINALYSIS, ROUTINE W REFLEX MICROSCOPIC - Abnormal; Notable for the following components:   Leukocytes,Ua TRACE (*)    All other components within normal limits  URINALYSIS, MICROSCOPIC (REFLEX) - Abnormal; Notable for the following components:   Bacteria, UA RARE (*)    All other components within normal limits  LIPASE, BLOOD  PREGNANCY, URINE     EKG:  EKG Interpretation  Date/Time:    Ventricular Rate:    PR Interval:    QRS Duration:   QT Interval:    QTC Calculation:   R Axis:     Text Interpretation:           RADIOLOGY: My personal review and interpretation of imaging: CT of the abdomen pelvis shows gallstones.  Right upper quadrant ultrasound shows gallstones without choledocholithiasis or cholecystitis.  I have personally reviewed all radiology reports.   CT ABDOMEN PELVIS W CONTRAST  Result Date: 10/07/2021 CLINICAL DATA:  Acute abdominal pain. EXAM: CT ABDOMEN AND PELVIS WITH CONTRAST TECHNIQUE: Multidetector CT imaging of the abdomen and pelvis was performed using the standard protocol following bolus administration of intravenous contrast. RADIATION DOSE REDUCTION: This exam was performed according to the departmental dose-optimization program which includes automated exposure control, adjustment of the mA and/or kV according to patient size and/or use of iterative reconstruction technique. CONTRAST:  173mL OMNIPAQUE IOHEXOL 300 MG/ML  SOLN COMPARISON:  01/20/2017 FINDINGS: Lower chest: No acute abnormality. Hepatobiliary: No focal liver abnormality.  Gallbladder appears partially decompressed containing a 1.8 cm stone. There is equivocal wall thickening which is nonspecific in partially decompressed gallbladder measuring up to 4 mm. No significant pericholecystic fluid. No signs of bile duct dilatation. Pancreas: Unremarkable. No pancreatic ductal dilatation or surrounding inflammatory changes. Spleen: Normal in size without focal abnormality. Adrenals/Urinary Tract: Normal appearance of the adrenal glands. Right kidney appears normal. There is a stone in the inferior pole of the left kidney measuring 4 mm, image 40/2. No mass or hydronephrosis identified bilaterally. Urinary bladder appears unremarkable. Stomach/Bowel: Stomach is within normal limits. Appendix appears normal. No evidence of bowel wall thickening, distention, or inflammatory changes. Vascular/Lymphatic: No significant vascular findings are present. No enlarged abdominal or pelvic lymph nodes. Reproductive: Uterus and bilateral adnexa are unremarkable. Other: No  free fluid or fluid collections. Musculoskeletal: No acute or significant osseous findings. IMPRESSION: 1. No acute findings within the abdomen or pelvis. 2. Nonobstructing left renal calculus. 3. Gallstone. Mild gallbladder wall thickening, which may reflect incomplete distention. If there are clinical signs or symptoms of gallbladder inflammation consider more definitive characterization with right upper quadrant sonogram. Electronically Signed   By: Kerby Moors M.D.   On: 10/07/2021 05:26   US ABDOMEN LIMITED RUQ (LIVER/GB)  Result Date: 10/07/2021 CLINICAL DATA:  Abnormal CT.  Abdominal pain. EXAM: ULTRASOUND ABDOMEN LIMITED RIGHT UPPER QUADRANT COMPARISON:  CT abdomen and pelvis 10/07/2021 FINDINGS: Gallbladder: Gallstones measuring up to 1.4 cm in size. No gallbladder wall thickening. No sonographic Murphy sign noted by sonographer. Common bile duct: Diameter: 5 mm Liver: No focal lesion identified. Within normal limits in  parenchymal echogenicity. Portal vein is patent on color Doppler imaging with normal direction of blood flow towards the liver. Other: None. IMPRESSION: Cholelithiasis without evidence of cholecystitis. Electronically Signed   By: Logan Bores M.D.   On: 10/07/2021 07:32     PROCEDURES:  Critical Care performed: No      Procedures    IMPRESSION / MDM / ASSESSMENT AND PLAN / ED COURSE  I reviewed the triage vital signs and the nursing notes.    Patient here with abdominal pain, nausea.   DIFFERENTIAL DIAGNOSIS (includes but not limited to):   Differential diagnosis includes cholelithiasis, cholecystitis, pancreatitis, colitis, diverticulitis, appendicitis, kidney stone, UTI, pyelonephritis.   PLAN: We will obtain labs, urine, CT of abdomen pelvis.  Exam is limited as she does appear slightly intoxicated and reports using heroin previously.  We will avoid narcotic pain medication given this history and give Toradol, Zofran.   MEDICATIONS GIVEN IN ED: Medications  sodium chloride 0.9 % bolus 1,000 mL (0 mLs Intravenous Stopped 10/07/21 0628)  ketorolac (TORADOL) 30 MG/ML injection 30 mg (30 mg Intravenous Given 10/07/21 0416)  ondansetron (ZOFRAN) injection 4 mg (4 mg Intravenous Given 10/07/21 0416)  iohexol (OMNIPAQUE) 300 MG/ML solution 100 mL (100 mLs Intravenous Contrast Given 10/07/21 0431)     ED COURSE: Patient reports her pain is improved.  Labs show normal WBC count, normal LFTs, Cr, lipase.  Urine not infected.  UPT negative.  CT scan concerning for gallstones and there is a question that she could have gallbladder wall thickening.  Will obtain right upper quadrant ultrasound.  She is agreeable to this plan.  She has been resting comfortably without vomiting.    Ultrasound shows gallstones but no choledocholithiasis, cholecystitis.  She continues to be well-appearing.  Have recommended follow-up with general surgery, low-fat diet.  Will discharge with prescriptions of  Bentyl and Zofran.  Again we will avoid narcotics given her history has I feel this can be very dangerous for her.    At this time, I do not feel there is any life-threatening condition present. I reviewed all nursing notes, vitals, pertinent previous records.  All labs, EKGs, imaging ordered have been independently reviewed and interpreted by myself.  I have reviewed nursing notes and appropriate previous records.  I feel the patient is safe to be discharged home without further emergent workup and can continue workup as an outpatient as needed. Discussed all findings and treatment plan with patient and significant other as well as usual and customary return precautions.  They verbalize understanding and are comfortable with this plan.  Outpatient follow-up has been provided as needed. All questions have been answered.    CONSULTS:  No admission to the hospital needed at this time as patient is well-appearing, nontoxic, afebrile with controlled symptoms and no vomiting.  No signs of cholecystitis.   OUTSIDE RECORDS REVIEWED: Reviewed previous records from [facility] in October 2019.  Patient was diagnosed with moderate dysplasia of the cervix.  Not currently getting any treatment.  Has no GU symptoms today.         FINAL CLINICAL IMPRESSION(S) / ED DIAGNOSES   Final diagnoses:  Gallstone  Generalized abdominal pain     Rx / DC Orders   ED Discharge Orders          Ordered    dicyclomine (BENTYL) 20 MG tablet  Every 8 hours PRN        10/07/21 0738    ondansetron (ZOFRAN-ODT) 4 MG disintegrating tablet  Every 6 hours PRN        10/07/21 0738             Note:  This document was prepared using Dragon voice recognition software and may include unintentional dictation errors.   Clayten Allcock, Delice Bison, DO 10/07/21 0829    Bula Cavalieri, Delice Bison, DO 10/07/21 1353

## 2021-10-10 IMAGING — US US EXTREM UP*L* LTD
1 series · 14 of 18 positions shown · non-contrast
Comparison: None.

CLINICAL DATA: IVDA, erythema and swelling at the antecubital fossa

EXAM:
ULTRASOUND LEFT UPPER EXTREMITY LIMITED
TECHNIQUE: Ultrasound examination of the upper extremity soft tissues was
performed in the area of clinical concern.

[Series 1: us soft tissue upper extremity limited left (non-v · 14 of 18 slices shown]
[im 1/18]
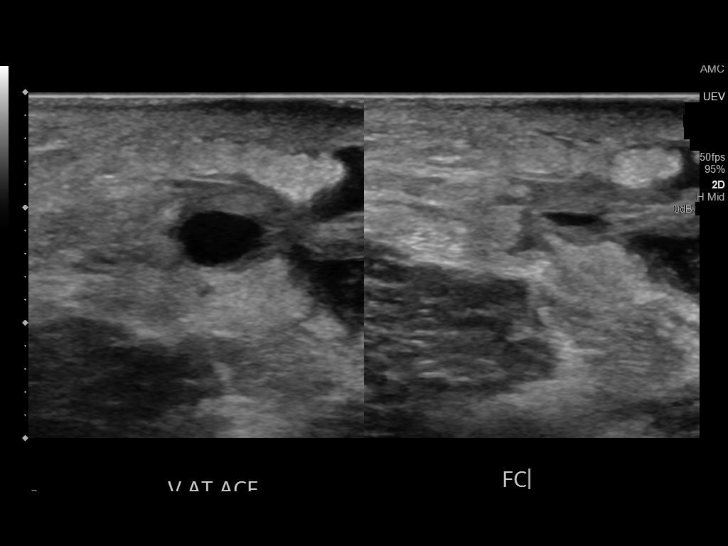
[im 2/18]
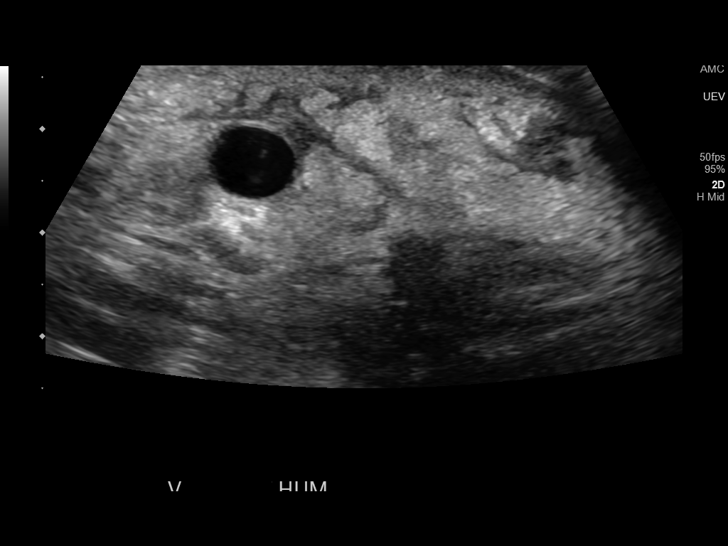
[im 4/18]
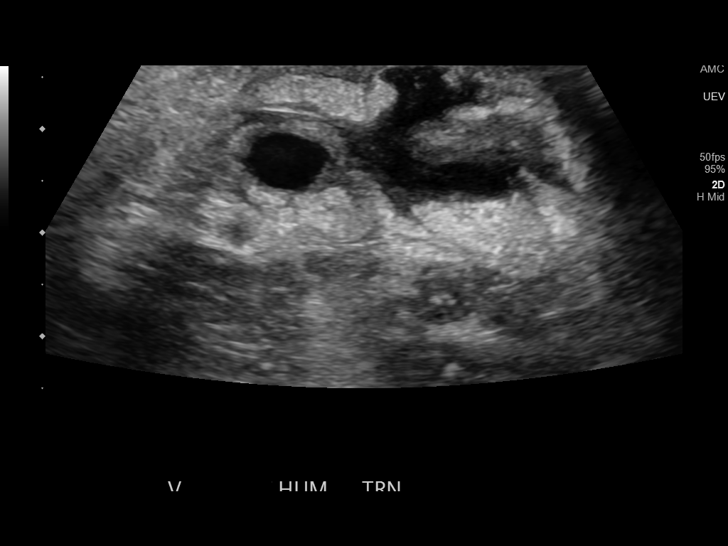
[im 5/18]
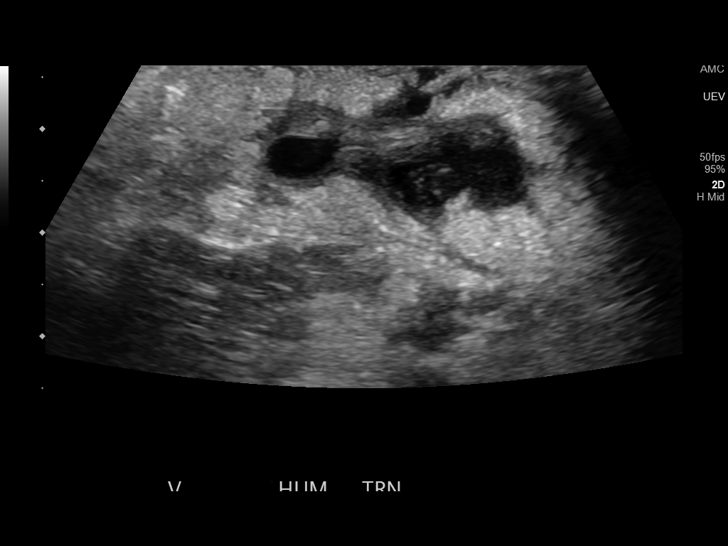
[im 6/18]
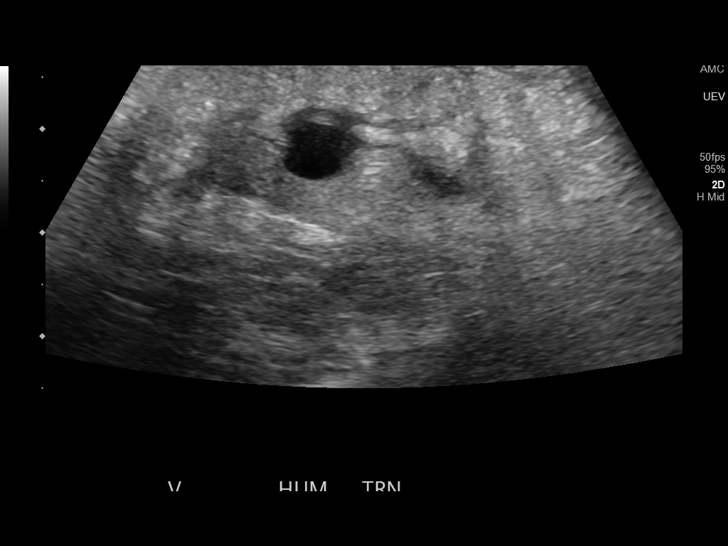
[im 8/18]
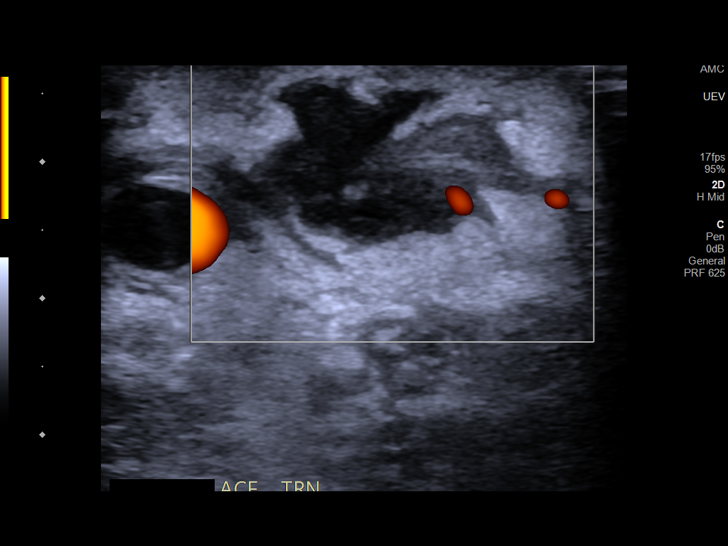
[im 9/18]
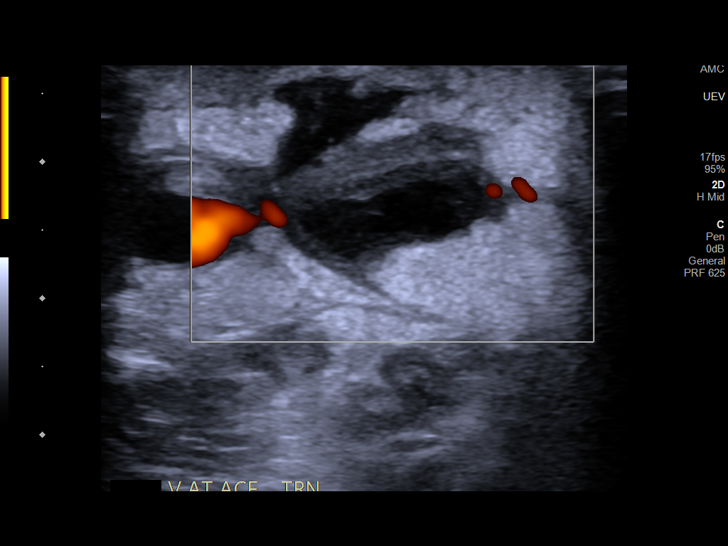
[im 10/18]
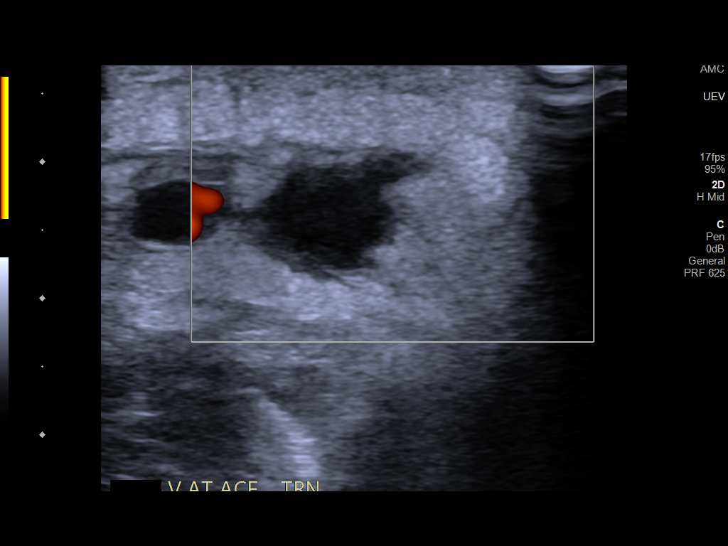
[im 11/18]
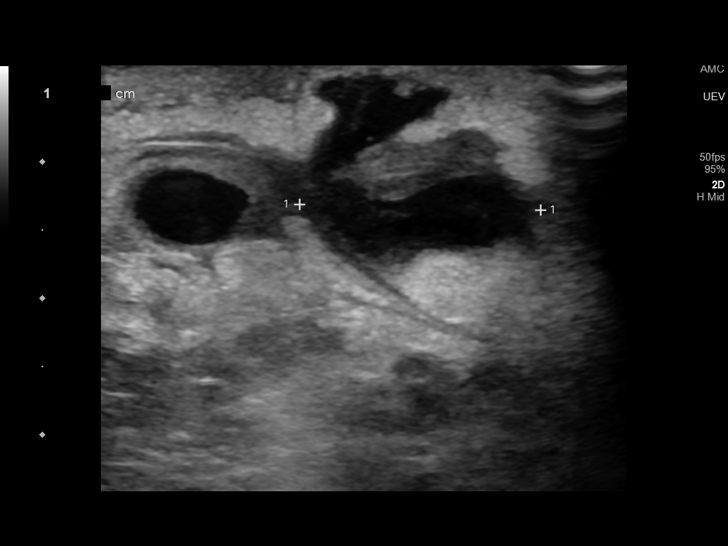
[im 13/18]
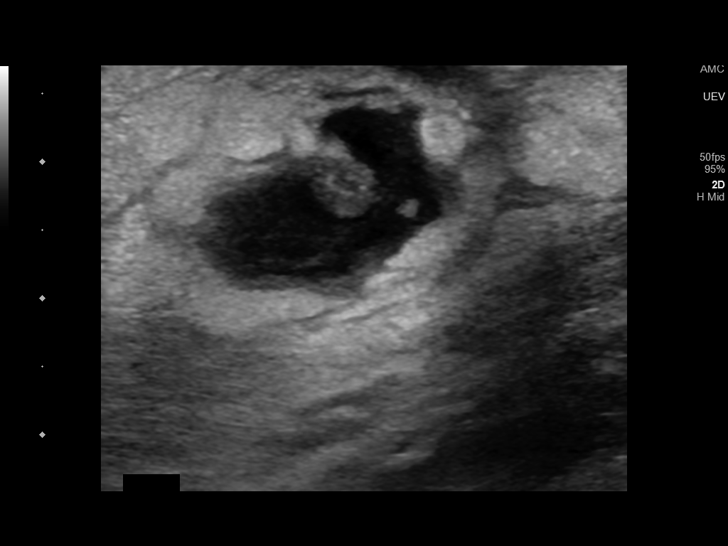
[im 14/18]
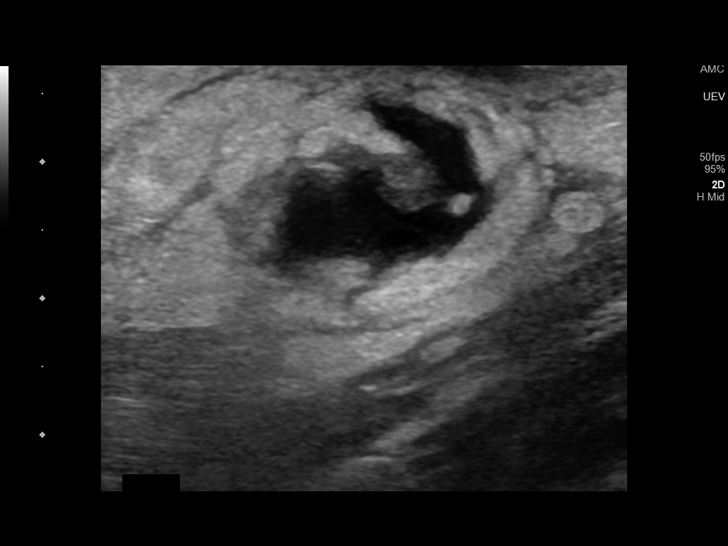
[im 15/18]
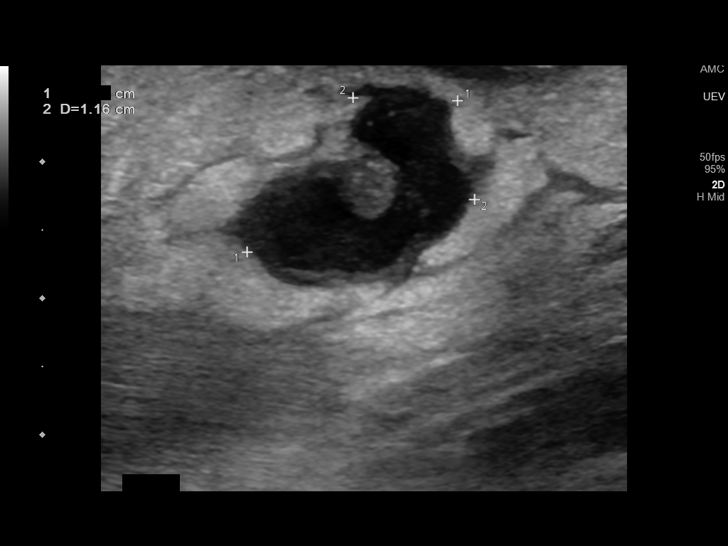
[im 17/18]
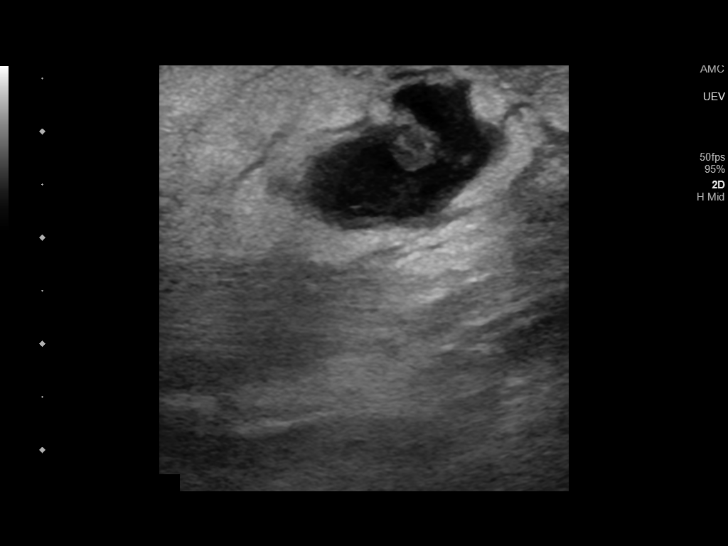
[im 18/18]
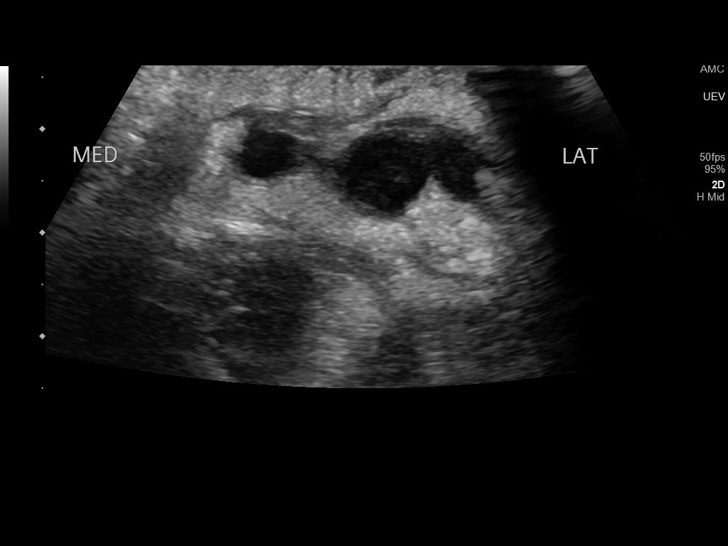

[14 of 18 positions shown; findings below may reference images not displayed]

FINDINGS: Targeted grayscale and color Doppler ultrasound evaluation of the
left antecubital fossa reveals a heterogeneous, complex loculated
fluid collection with surrounding soft tissue edema adjacent to the
cephalic vein, concerning for developing abscess with overlying
cellulitic features. Collection measures approximately 1.7 x 1.9 x
1.2 cm in size.

No clear superficial venous thrombus within the cephalic vein.
IMPRESSION: 1.7 x 1.9 x 1.2 cm heterogeneous, complex collection in the
antecubital fossa adjacent the cephalic vein concerning for abscess
with surrounding soft tissue edema and overlying cellulitic
features.

No associated superficial venous thrombus the cephalic vein.

## 2022-06-27 IMAGING — US US ABDOMEN LIMITED
1 series · 14 of 25 positions shown · non-contrast
Comparison: CT abdomen and pelvis 10/07/2021

CLINICAL DATA: Abnormal CT.  Abdominal pain.

EXAM:
ULTRASOUND ABDOMEN LIMITED RIGHT UPPER QUADRANT

[Series 1: us abdomen limited ruq (liver/gb) · 14 of 50 slices shown]
[im 1/50]
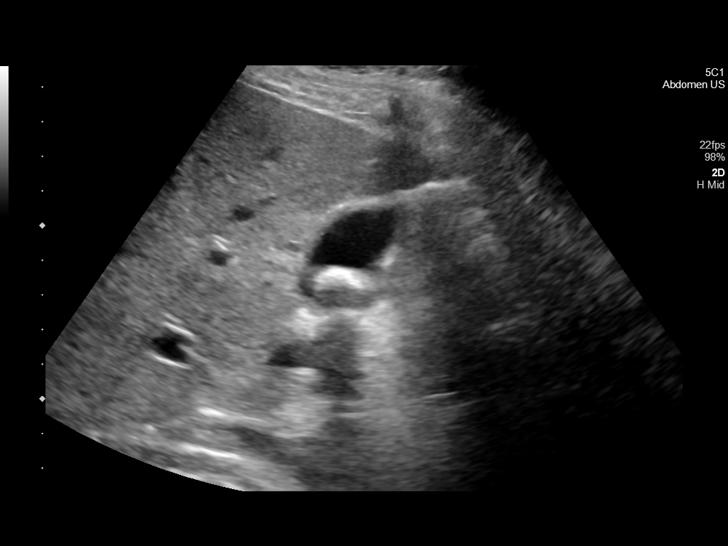
[im 5/50]
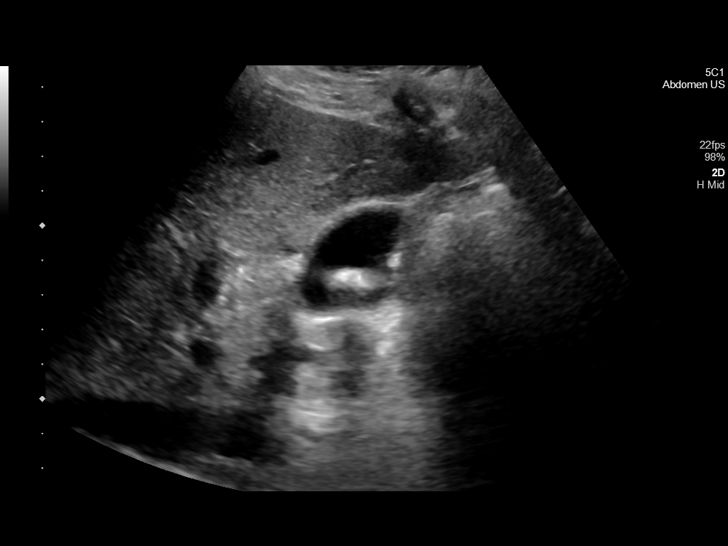
[im 9/50]
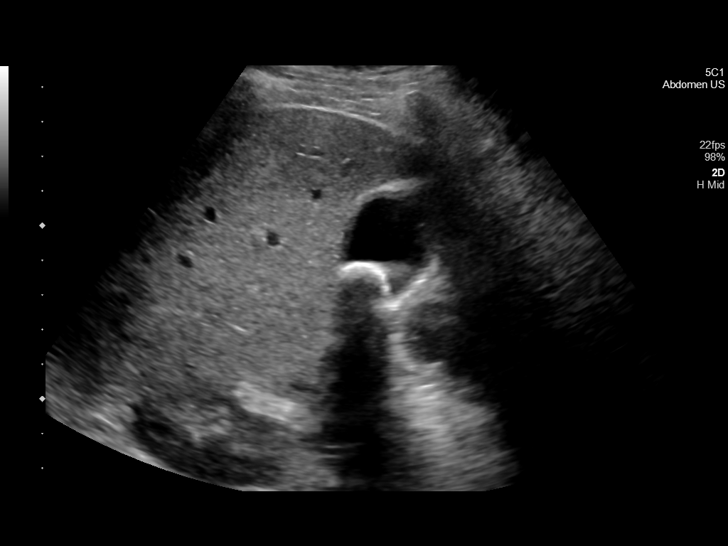
[im 13/50]
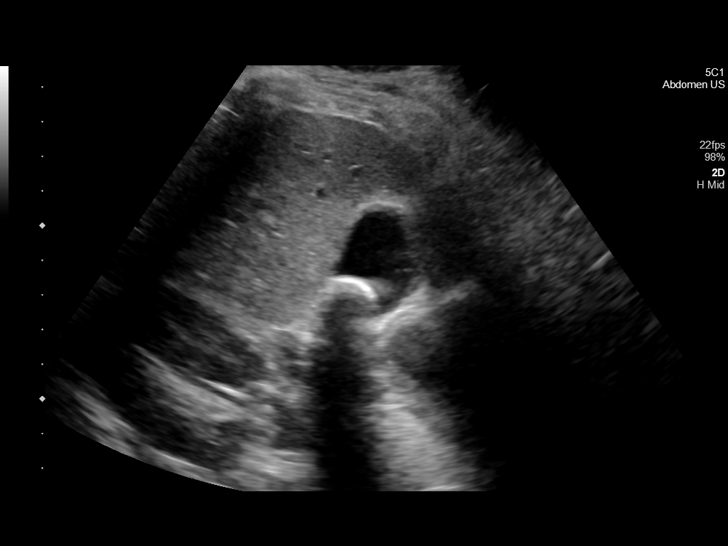
[im 17/50]
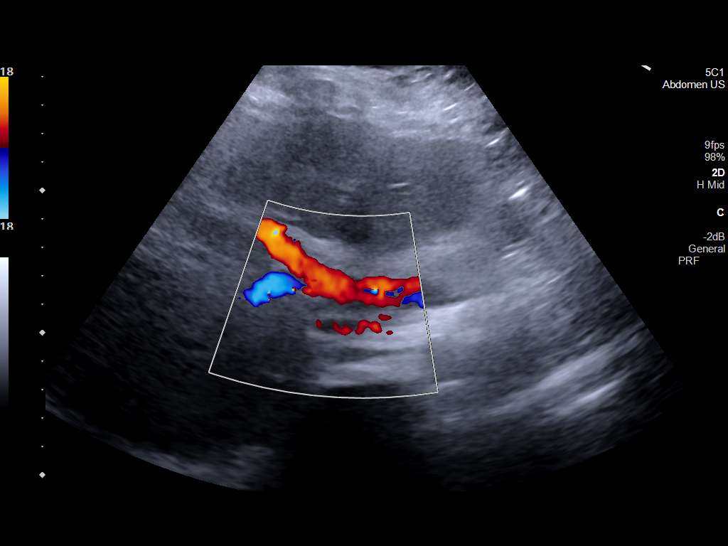
[im 19/50]
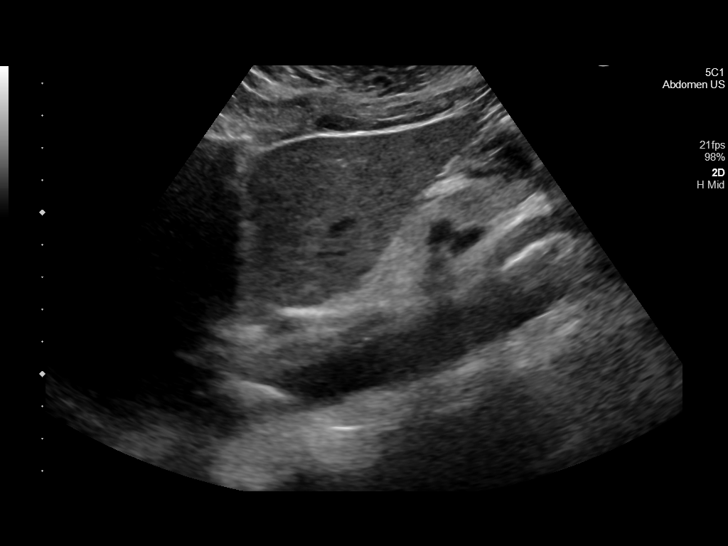
[im 23/50]
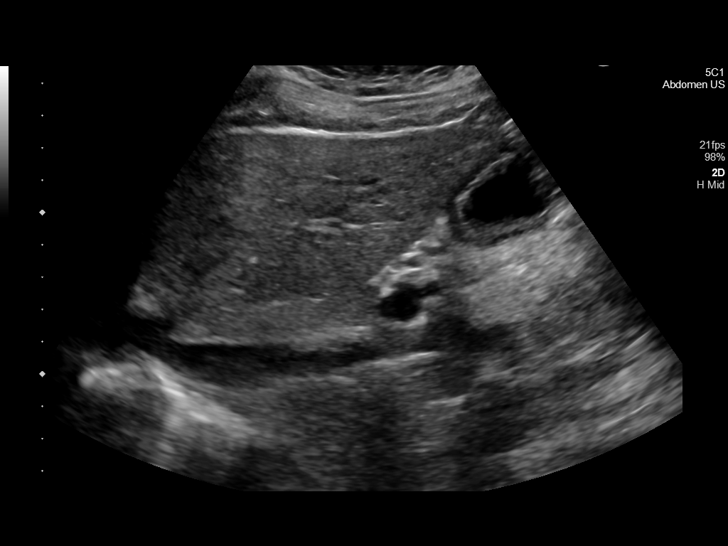
[im 27/50]
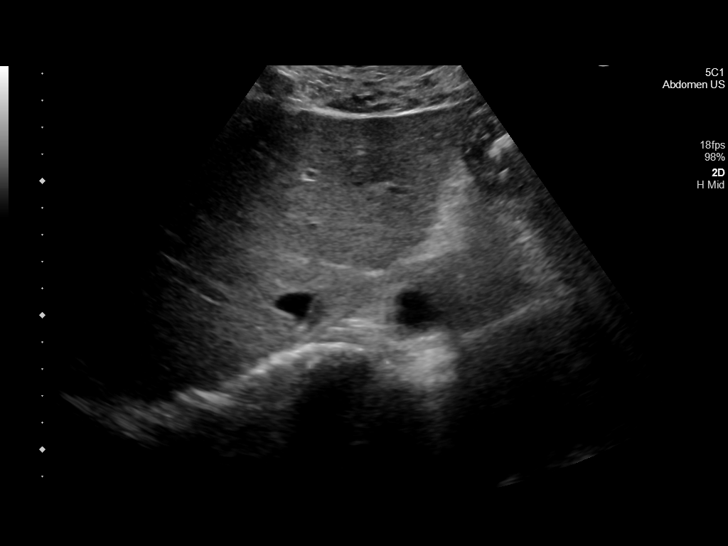
[im 31/50]
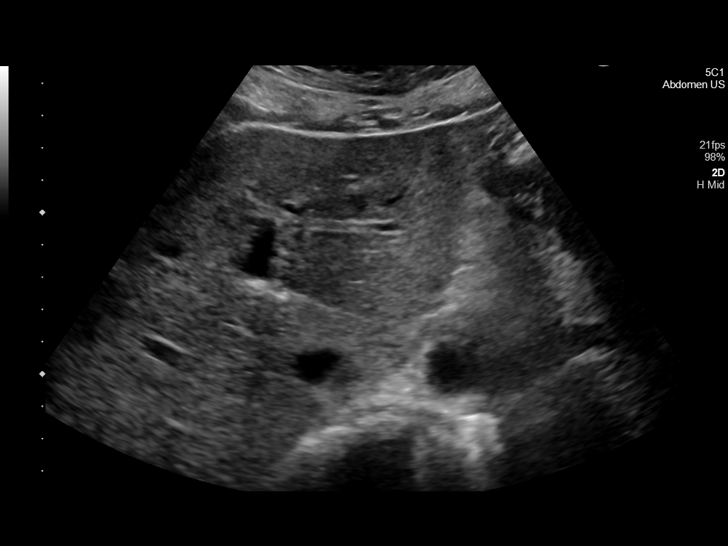
[im 33/50]
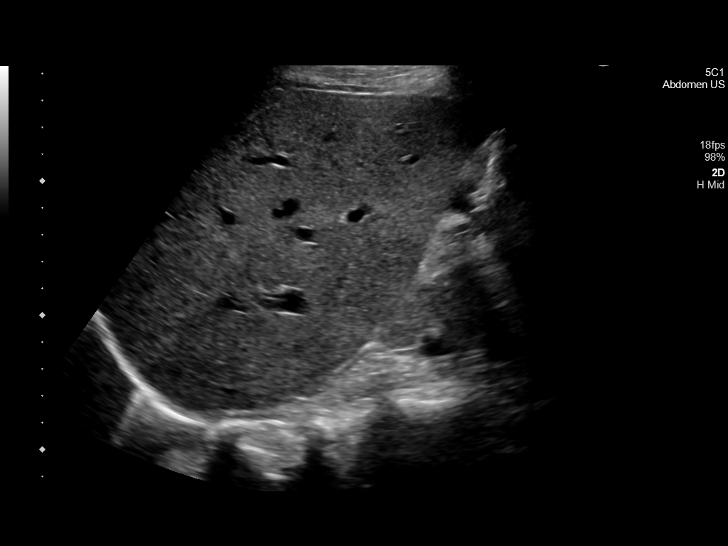
[im 37/50]
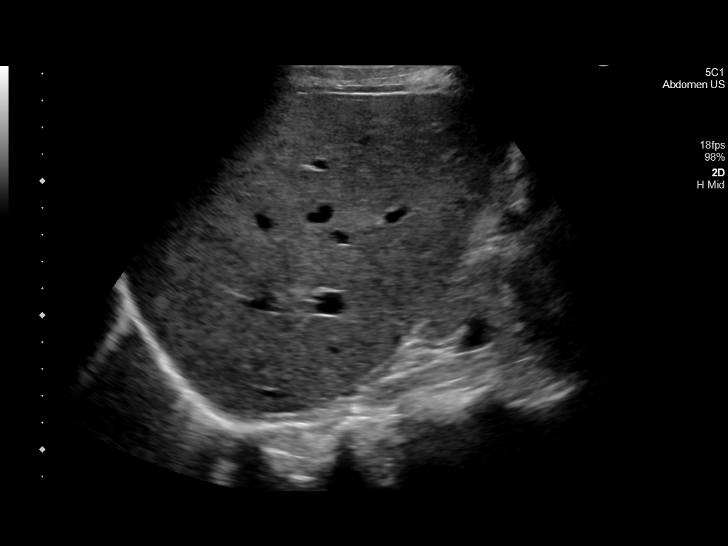
[im 41/50]
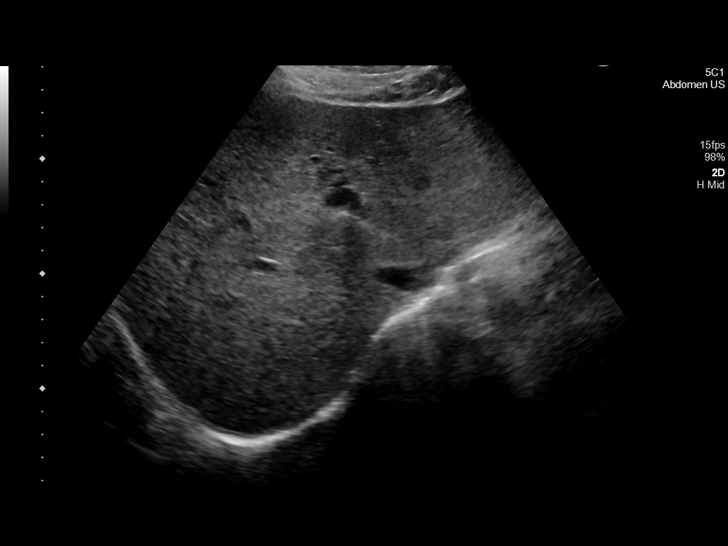
[im 45/50]
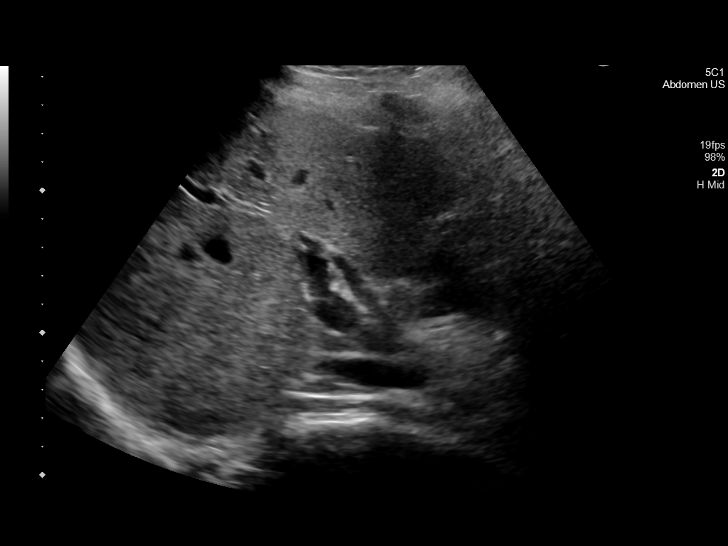
[im 50/50]
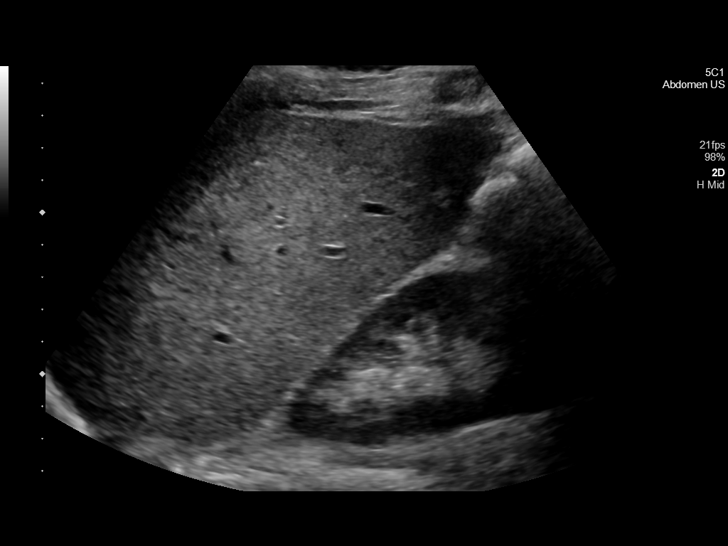

[14 of 25 positions shown; findings below may reference images not displayed]

FINDINGS: Gallbladder:

Gallstones measuring up to 1.4 cm in size. No gallbladder wall
thickening. No sonographic Murphy sign noted by sonographer.

Common bile duct:

Diameter: 5 mm

Liver:

No focal lesion identified. Within normal limits in parenchymal
echogenicity. Portal vein is patent on color Doppler imaging with
normal direction of blood flow towards the liver.

Other: None.
IMPRESSION: Cholelithiasis without evidence of cholecystitis.

## 2023-03-11 ENCOUNTER — Encounter: Payer: Self-pay | Admitting: Emergency Medicine

## 2023-03-11 ENCOUNTER — Emergency Department
Admission: EM | Admit: 2023-03-11 | Discharge: 2023-03-11 | Disposition: A | Discharge status: Home / Self Care | Payer: Medicaid Other | Attending: Emergency Medicine | Admitting: Emergency Medicine

## 2023-03-11 DIAGNOSIS — N760 Acute vaginitis: Secondary | ICD-10-CM | POA: Insufficient documentation

## 2023-03-11 DIAGNOSIS — B9689 Other specified bacterial agents as the cause of diseases classified elsewhere: Secondary | ICD-10-CM | POA: Diagnosis not present

## 2023-03-11 DIAGNOSIS — N3000 Acute cystitis without hematuria: Secondary | ICD-10-CM | POA: Diagnosis not present

## 2023-03-11 DIAGNOSIS — N898 Other specified noninflammatory disorders of vagina: Secondary | ICD-10-CM | POA: Diagnosis present

## 2023-03-11 LAB — COMPREHENSIVE METABOLIC PANEL
ALT: 20 U/L (ref 0–44)
AST: 21 U/L (ref 15–41)
Albumin: 4.2 g/dL (ref 3.5–5.0)
Alkaline Phosphatase: 58 U/L (ref 38–126)
Anion gap: 9 (ref 5–15)
BUN: 12 mg/dL (ref 6–20)
CO2: 22 mmol/L (ref 22–32)
Calcium: 9 mg/dL (ref 8.9–10.3)
Chloride: 104 mmol/L (ref 98–111)
Creatinine, Ser: 0.65 mg/dL (ref 0.44–1.00)
GFR, Estimated: >60 mL/min (ref >60)
Glucose, Bld: 100 mg/dL — ABNORMAL HIGH (ref 70–99)
Potassium: 3.4 mmol/L — ABNORMAL LOW (ref 3.5–5.1)
Sodium: 135 mmol/L (ref 135–145)
Total Bilirubin: 0.5 mg/dL (ref 0.3–1.2)
Total Protein: 6.9 g/dL (ref 6.5–8.1)

## 2023-03-11 LAB — CBC
HCT: 36 % (ref 36.0–46.0)
Hemoglobin: 11.2 g/dL — ABNORMAL LOW (ref 12.0–15.0)
MCH: 25.2 pg — ABNORMAL LOW (ref 26.0–34.0)
MCHC: 31.1 g/dL (ref 30.0–36.0)
MCV: 80.9 fL (ref 80.0–100.0)
Platelets: 246 10*3/uL (ref 150–400)
RBC: 4.45 MIL/uL (ref 3.87–5.11)
RDW: 13.8 % (ref 11.5–15.5)
WBC: 7.5 10*3/uL (ref 4.0–10.5)
nRBC: 0 % (ref 0.0–0.2)

## 2023-03-11 LAB — WET PREP, GENITAL
Sperm: NONE SEEN
Trich, Wet Prep: NONE SEEN
WBC, Wet Prep HPF POC: <10 (ref <10)
Yeast Wet Prep HPF POC: NONE SEEN

## 2023-03-11 LAB — CHLAMYDIA/NGC RT PCR (ARMC ONLY)
Chlamydia Tr: NOT DETECTED
N gonorrhoeae: NOT DETECTED

## 2023-03-11 LAB — URINALYSIS, ROUTINE W REFLEX MICROSCOPIC
Bilirubin Urine: NEGATIVE
Glucose, UA: NEGATIVE mg/dL
Ketones, ur: NEGATIVE mg/dL
Nitrite: NEGATIVE
Protein, ur: NEGATIVE mg/dL
Specific Gravity, Urine: 1.015 (ref 1.005–1.030)
pH: 5 (ref 5.0–8.0)

## 2023-03-11 LAB — LIPASE, BLOOD: Lipase: 39 U/L (ref 11–51)

## 2023-03-11 LAB — POC URINE PREG, ED: Preg Test, Ur: NEGATIVE

## 2023-03-11 MED ORDER — ONDANSETRON 4 MG PO TBDP
4.0000 mg | ORAL_TABLET | Freq: Once | ORAL | Status: AC
  Administered 2023-03-11: 4 mg via ORAL
  Filled 2023-03-11: qty 1

## 2023-03-11 MED ORDER — CEPHALEXIN 500 MG PO CAPS: 500.0000 mg | ORAL_CAPSULE | Freq: Four times a day (QID) | ORAL | 0 refills | Status: AC

## 2023-03-11 MED ORDER — METRONIDAZOLE 500 MG PO TABS: 500.0000 mg | ORAL_TABLET | Freq: Two times a day (BID) | ORAL | 0 refills | Status: AC

## 2023-03-11 MED ORDER — CEFTRIAXONE SODIUM 1 G IJ SOLR
1.0000 g | Freq: Once | INTRAMUSCULAR | Status: AC
  Administered 2023-03-11: 1 g via INTRAMUSCULAR
  Filled 2023-03-11: qty 10

## 2023-03-11 NOTE — Discharge Instructions (Signed)
Take Keflex 4 times daily for the next 7 days. Take Flagyl twice daily for the next 7 days.

## 2023-03-11 NOTE — ED Notes (Signed)
Pt instructed how to self-swab by [place], PA

## 2023-03-11 NOTE — ED Provider Notes (Signed)
[facility] Provider Note  Patient Contact: 8:01 PM (approximate)   History   Vaginal Discharge   HPI  Penny Zimmerman is a 37 y.o. female presents to the emergency department with increased vaginal discharge and left-sided low back pain after having unprotected sex approximately 1 week ago.  Patient has also had some just generalized lower abdominal discomfort.  No fever or chills.  She does not know if her partner tested positive for STDs.  No dysuria or rash.      Physical Exam   Triage Vital Signs: ED Triage Vitals  Enc Vitals Group     BP 03/11/23 1944 134/77     Pulse Rate 03/11/23 1944 75     Resp 03/11/23 1944 16     Temp 03/11/23 1944 98.4 F (36.9 C)     Temp Source 03/11/23 1944 Oral     SpO2 03/11/23 1944 100 %     Weight 03/11/23 1945 180 lb 12.4 oz (82 kg)     Height 03/11/23 1945 5\' 9"  (1.753 m)     Head Circumference --      Peak Flow --      Pain Score 03/11/23 1945 5     Pain Loc --      Pain Edu? --      Excl. in [place]? --     Most recent vital signs: Vitals:   03/11/23 1944 03/11/23 2217  BP: 134/77 121/64  Pulse: 75 64  Resp: 16 16  Temp: 98.4 F (36.9 C) 98 F (36.7 C)  SpO2: 100% 100%     General: Alert and in no acute distress. Eyes:  PERRL. EOMI. Head: No acute traumatic findings ENT:      Nose: No congestion/rhinnorhea.      Mouth/Throat: Mucous membranes are moist. Neck: No stridor. No cervical spine tenderness to palpation. Cardiovascular:  Good peripheral perfusion Respiratory: Normal respiratory effort without tachypnea or retractions. Lungs CTAB. Good air entry to the bases with no decreased or absent breath sounds. Gastrointestinal: Bowel sounds 4 quadrants. Soft and nontender to palpation. No guarding or rigidity. No palpable masses. No distention. No CVA tenderness. Musculoskeletal: Full range of motion to all extremities.  Neurologic:  No gross focal neurologic deficits are appreciated.  Skin:    No rash noted   ED Results / Procedures / Treatments   Labs (all labs ordered are listed, but only abnormal results are displayed) Labs Reviewed  WET PREP, GENITAL - Abnormal; Notable for the following components:      Result Value   Clue Cells Wet Prep HPF POC PRESENT (*)    All other components within normal limits  URINALYSIS, ROUTINE W REFLEX MICROSCOPIC - Abnormal; Notable for the following components:   Color, Urine YELLOW (*)    APPearance HAZY (*)    Hgb urine dipstick SMALL (*)    Leukocytes,Ua LARGE (*)    Bacteria, UA RARE (*)    All other components within normal limits  COMPREHENSIVE METABOLIC PANEL - Abnormal; Notable for the following components:   Potassium 3.4 (*)    Glucose, Bld 100 (*)    All other components within normal limits  CBC - Abnormal; Notable for the following components:   Hemoglobin 11.2 (*)    MCH 25.2 (*)    All other components within normal limits  CHLAMYDIA/NGC RT PCR ([facility] ONLY)            LIPASE, BLOOD  POC URINE PREG, ED  PROCEDURES:  Critical Care performed: No  Procedures   MEDICATIONS ORDERED IN ED: Medications  cefTRIAXone (ROCEPHIN) injection 1 g (1 g Intramuscular Given 03/11/23 2115)  ondansetron (ZOFRAN-ODT) disintegrating tablet 4 mg (4 mg Oral Given 03/11/23 2123)     IMPRESSION / MDM / ASSESSMENT AND PLAN / ED COURSE  I reviewed the triage vital signs and the nursing notes.                              Assessment and plan:  STD exposure UTI 37 year old female presents to the emergency department with increased vaginal discharge and left-sided low back pain after having unprotected sex 1 week ago.  Vital signs were reassuring at triage.  On exam, patient was alert and nontoxic-appearing.   CBC and MP reassuring.  Urinalysis indicates a large amount of leukocytes and small amount of blood concerning for UTI.  Gonorrhea and Chlamydia testing was negative.  Will cover patient for pyelonephritis  with Rocephin in the emergency department and Keflex at discharge.  Patient also tested positive for BV with clue cells identified on wet prep.  Will treat with Flagyl twice daily for the next week.   FINAL CLINICAL IMPRESSION(S) / ED DIAGNOSES   Final diagnoses:  BV (bacterial vaginosis)  Acute cystitis without hematuria     Rx / DC Orders   ED Discharge Orders          Ordered    cephALEXin (KEFLEX) 500 MG capsule  4 times daily        03/11/23 2214    metroNIDAZOLE (FLAGYL) 500 MG tablet  2 times daily        03/11/23 2214             Note:  This document was prepared using Dragon voice recognition software and may include unintentional dictation errors.   Pia Mau [employer], Cordelia Poche 03/11/23 2219    Chesley Noon, MD 03/12/23 1901

## 2023-03-11 NOTE — ED Triage Notes (Signed)
Pt c/o vaginal discharge and lower left back pain x3-4 days. Pt reports she did have unprotected vaginal intercourse 1 week prior with new partner. Pt reports discharge is foul in smell with brown/pink/white in color.

## 2023-05-18 ENCOUNTER — Emergency Department
Admission: EM | Admit: 2023-05-18 | Discharge: 2023-05-18 | Disposition: A | Discharge status: Home / Self Care | Payer: MEDICAID | Attending: Emergency Medicine | Admitting: Emergency Medicine

## 2023-05-18 ENCOUNTER — Other Ambulatory Visit: Payer: Self-pay

## 2023-05-18 ENCOUNTER — Encounter: Payer: Self-pay | Admitting: *Deleted

## 2023-05-18 DIAGNOSIS — N760 Acute vaginitis: Secondary | ICD-10-CM | POA: Diagnosis not present

## 2023-05-18 DIAGNOSIS — N898 Other specified noninflammatory disorders of vagina: Secondary | ICD-10-CM | POA: Diagnosis present

## 2023-05-18 LAB — URINALYSIS, ROUTINE W REFLEX MICROSCOPIC
Bilirubin Urine: NEGATIVE
Glucose, UA: NEGATIVE mg/dL
Hgb urine dipstick: NEGATIVE
Ketones, ur: NEGATIVE mg/dL
Nitrite: NEGATIVE
Protein, ur: NEGATIVE mg/dL
Specific Gravity, Urine: 1.008 (ref 1.005–1.030)
pH: 6 (ref 5.0–8.0)

## 2023-05-18 LAB — WET PREP, GENITAL
Sperm: NONE SEEN
Trich, Wet Prep: NONE SEEN
WBC, Wet Prep HPF POC: >=10 — AB (ref <10)
Yeast Wet Prep HPF POC: NONE SEEN

## 2023-05-18 LAB — CHLAMYDIA/NGC RT PCR (ARMC ONLY)
Chlamydia Tr: NOT DETECTED
N gonorrhoeae: NOT DETECTED

## 2023-05-18 LAB — POC URINE PREG, ED: Preg Test, Ur: NEGATIVE

## 2023-05-18 MED ORDER — METRONIDAZOLE 500 MG PO TABS: 500.0000 mg | ORAL_TABLET | Freq: Two times a day (BID) | ORAL | 0 refills | Status: AC

## 2023-05-18 MED ORDER — IBUPROFEN 600 MG PO TABS
600.0000 mg | ORAL_TABLET | Freq: Once | ORAL | Status: AC
  Administered 2023-05-18: 600 mg via ORAL
  Filled 2023-05-18: qty 1

## 2023-05-18 MED ORDER — METRONIDAZOLE 500 MG PO TABS
500.0000 mg | ORAL_TABLET | Freq: Once | ORAL | Status: AC
  Administered 2023-05-18: 500 mg via ORAL
  Filled 2023-05-18: qty 1

## 2023-05-18 NOTE — ED Notes (Signed)
ED Provider at bedside. 

## 2023-05-18 NOTE — ED Notes (Signed)
Poct pregnancy Negative 

## 2023-05-18 NOTE — Discharge Instructions (Addendum)
These take the antibiotics as prescribed.  I have attached information for OB/GYN follow-up.  You will need to call the office and set up an appointment.

## 2023-05-18 NOTE — ED Provider Notes (Signed)
[facility] Provider Note    Event Date/Time   First MD Initiated Contact with Patient 05/18/23 2049     (approximate)   History   Vaginal Discharge   HPI  Penny Zimmerman is a 37 y.o. female who presents for evaluation of vaginal discharge.  Patient reports for the past 2 weeks she has had foul-smelling discharge and reports it is greenish-yellow.  She is sexually active.  She denies vaginal itching.  She does endorse dyspareunia and generalized pelvic pain.  She was treated for a UTI and BV a couple months ago and stated she did have full resolution of her symptoms but she feels like it has come back now.      Physical Exam   Triage Vital Signs: ED Triage Vitals [05/18/23 1943]  Encounter Vitals Group     BP 128/76     Systolic BP Percentile      Diastolic BP Percentile      Pulse Rate (!) 57     Resp 18     Temp 98 F (36.7 C)     Temp Source Oral     SpO2 100 %     Weight 190 lb (86.2 kg)     Height 5\' 8"  (1.727 m)     Head Circumference      Peak Flow      Pain Score 0     Pain Loc      Pain Education      Exclude from Growth Chart     Most recent vital signs: Vitals:   05/18/23 1943  BP: 128/76  Pulse: (!) 57  Resp: 18  Temp: 98 F (36.7 C)  SpO2: 100%    General: Awake, no distress.  CV:  Good peripheral perfusion.  Resp:  Normal effort.  Abd:  No distention.  Pelvic:  No lesions or bumps on external genitalia, milky discharge in vaginal vault, no cervical motion tenderness   ED Results / Procedures / Treatments   Labs (all labs ordered are listed, but only abnormal results are displayed) Labs Reviewed  WET PREP, GENITAL - Abnormal; Notable for the following components:      Result Value   Clue Cells Wet Prep HPF POC PRESENT (*)    WBC, Wet Prep HPF POC >=10 (*)    All other components within normal limits  URINALYSIS, ROUTINE W REFLEX MICROSCOPIC - Abnormal; Notable for the following components:   Color, Urine  STRAW (*)    APPearance CLEAR (*)    Leukocytes,Ua MODERATE (*)    Bacteria, UA RARE (*)    All other components within normal limits  CHLAMYDIA/NGC RT PCR ([facility] ONLY)            POC URINE PREG, ED     PROCEDURES:  Critical Care performed: No  Procedures   MEDICATIONS ORDERED IN ED: Medications  ibuprofen (ADVIL) tablet 600 mg (600 mg Oral Given 05/18/23 2129)  metroNIDAZOLE (FLAGYL) tablet 500 mg (500 mg Oral Given 05/18/23 2317)     IMPRESSION / MDM / ASSESSMENT AND PLAN / ED COURSE  I reviewed the triage vital signs and the nursing notes.                             37 year old female presents for evaluation of vaginal discharge.  VSS in triage and patient NAD on exam.  Differential diagnosis includes, but is not limited to, yeast  infection, BV, trichomoniasis, gonorrhea, chlamydia.  Patient's presentation is most consistent with acute complicated illness / injury requiring diagnostic workup.  Urine pregnancy test is negative.  Wet prep notable for clue cells and white blood cells.  UA shows moderate leukocytes with rare bacteria.  Gonorrhea chlamydia test was negative.  I will treat patient for bacterial vaginosis with oral antibiotics, she was given her first dose while in the ED tonight.  I will also give her follow-up with OB/GYN as she states this has been an ongoing issue.  Patient was agreeable to plan, voiced understanding and all questions were answered.  Patient stable at discharge.      FINAL CLINICAL IMPRESSION(S) / ED DIAGNOSES   Final diagnoses:  BV (bacterial vaginosis)     Rx / DC Orders   ED Discharge Orders          Ordered    metroNIDAZOLE (FLAGYL) 500 MG tablet  2 times daily        05/18/23 2314             Note:  This document was prepared using Dragon voice recognition software and may include unintentional dictation errors.   Cameron Ali, PA-C 05/18/23 2318    Dionne Bucy, MD 05/19/23 [phone]

## 2023-05-18 NOTE — ED Triage Notes (Signed)
Pt reports vaginal discharge for 2-3 days.  States similar sx 2 months ago.  No vag bleeding.  Pt reports dysuria.     Pt alert.

## 2024-02-20 ENCOUNTER — Emergency Department
Admission: EM | Admit: 2024-02-20 | Discharge: 2024-02-20 | Disposition: A | Discharge status: Home / Self Care | Payer: MEDICAID | Attending: Emergency Medicine | Admitting: Emergency Medicine

## 2024-02-20 ENCOUNTER — Other Ambulatory Visit: Payer: Self-pay

## 2024-02-20 DIAGNOSIS — R102 Pelvic and perineal pain: Secondary | ICD-10-CM | POA: Diagnosis present

## 2024-02-20 DIAGNOSIS — N760 Acute vaginitis: Secondary | ICD-10-CM | POA: Diagnosis not present

## 2024-02-20 LAB — WET PREP, GENITAL
Sperm: NONE SEEN
Trich, Wet Prep: NONE SEEN
WBC, Wet Prep HPF POC: >=10 — AB (ref <10)
Yeast Wet Prep HPF POC: NONE SEEN

## 2024-02-20 LAB — CHLAMYDIA/NGC RT PCR (ARMC ONLY)
Chlamydia Tr: NOT DETECTED
N gonorrhoeae: NOT DETECTED

## 2024-02-20 LAB — URINALYSIS, ROUTINE W REFLEX MICROSCOPIC
Bilirubin Urine: NEGATIVE
Glucose, UA: NEGATIVE mg/dL
Hgb urine dipstick: NEGATIVE
Ketones, ur: 5 mg/dL — AB
Nitrite: NEGATIVE
Protein, ur: NEGATIVE mg/dL
Specific Gravity, Urine: 1.012 (ref 1.005–1.030)
pH: 5 (ref 5.0–8.0)

## 2024-02-20 LAB — POC URINE PREG, ED: Preg Test, Ur: NEGATIVE

## 2024-02-20 MED ORDER — METRONIDAZOLE 500 MG PO TABS
500.0000 mg | ORAL_TABLET | Freq: Once | ORAL | Status: AC
  Administered 2024-02-20: 500 mg via ORAL
  Filled 2024-02-20: qty 1

## 2024-02-20 MED ORDER — METRONIDAZOLE 500 MG PO TABS: 500.0000 mg | ORAL_TABLET | Freq: Two times a day (BID) | ORAL | 0 refills | Status: AC

## 2024-02-20 MED ORDER — ACETAMINOPHEN 500 MG PO TABS
1000.0000 mg | ORAL_TABLET | Freq: Once | ORAL | Status: AC
  Administered 2024-02-20: 1000 mg via ORAL
  Filled 2024-02-20: qty 2

## 2024-02-20 NOTE — ED Triage Notes (Addendum)
 Pt to ED via POV c/o vaginal discharge and pelvic pain. Has been going on for a few days. Endorses foul odor. Reports some oliguria for past 2 days

## 2024-02-20 NOTE — ED Provider Notes (Signed)
 [facility] Emergency Department Provider Note     Event Date/Time   First MD Initiated Contact with Patient 02/20/24 2204     (approximate)   History   Vaginal Discharge   HPI  Penny Zimmerman is a 38 y.o. female with a history of depression, OCD, drug use disorder, presents to the ED endorsing pelvic discomfort and vaginal discharge with odor.  She endorses a few days of symptoms.  By her report, she has had BV recurrently in the past.  She denies any dysuria, hematuria, abnormal vaginal bleeding.  Physical Exam   Triage Vital Signs: ED Triage Vitals  Encounter Vitals Group     BP 02/20/24 2046 134/82     Systolic BP Percentile --      Diastolic BP Percentile --      Pulse Rate 02/20/24 2046 99     Resp 02/20/24 2046 18     Temp 02/20/24 2046 98.2 F (36.8 C)     Temp Source 02/20/24 2046 Oral     SpO2 02/20/24 2046 99 %     Weight 02/20/24 2044 190 lb (86.2 kg)     Height 02/20/24 2044 5' 9 (1.753 m)     Head Circumference --      Peak Flow --      Pain Score 02/20/24 2044 8     Pain Loc --      Pain Education --      Exclude from Growth Chart --     Most recent vital signs: Vitals:   02/20/24 2046 02/20/24 2236  BP: 134/82 130/74  Pulse: 99 90  Resp: 18 18  Temp: 98.2 F (36.8 C)   SpO2: 99% 98%    General Awake, no distress.  NAD HEENT NCAT. PERRL. EOMI. No rhinorrhea. Mucous membranes are moist.  CV:  Good peripheral perfusion.  RESP:  Normal effort.  ABD:  No distention.  GYN:  Declined by patient   ED Results / Procedures / Treatments   Labs (all labs ordered are listed, but only abnormal results are displayed) Labs Reviewed  WET PREP, GENITAL - Abnormal; Notable for the following components:      Result Value   Clue Cells Wet Prep HPF POC PRESENT (*)    WBC, Wet Prep HPF POC >=10 (*)    All other components within normal limits  URINALYSIS, ROUTINE W REFLEX MICROSCOPIC - Abnormal; Notable for the following  components:   Color, Urine YELLOW (*)    APPearance HAZY (*)    Ketones, ur 5 (*)    Leukocytes,Ua TRACE (*)    Bacteria, UA RARE (*)    All other components within normal limits  CHLAMYDIA/NGC RT PCR ([facility] ONLY)            POC URINE PREG, ED    EKG   RADIOLOGY   No results found.   PROCEDURES:  Critical Care performed: No  Procedures   MEDICATIONS ORDERED IN ED: Medications  acetaminophen  (TYLENOL ) tablet 1,000 mg (1,000 mg Oral Given 02/20/24 2049)  metroNIDAZOLE  (FLAGYL ) tablet 500 mg (500 mg Oral Given 02/20/24 2210)     IMPRESSION / MDM / ASSESSMENT AND PLAN / ED COURSE  I reviewed the triage vital signs and the nursing notes.                              Differential diagnosis includes, but is not limited to,  UTI, vaginitis, gonorrhea, chlamydia  Patient's presentation is most consistent with acute complicated illness / injury requiring diagnostic workup.  Patient's diagnosis is consistent with BV.  With reassuring exam and workup at this time.  No evidence of acute UTI or the lab abnormalities noted.  Patient will be discharged home with prescriptions for metronidazole . Patient is to follow up with GYN as discussed, as needed or otherwise directed. Patient is given ED precautions to return to the ED for any worsening or new symptoms.   FINAL CLINICAL IMPRESSION(S) / ED DIAGNOSES   Final diagnoses:  BV (bacterial vaginosis)     Rx / DC Orders   ED Discharge Orders          Ordered    metroNIDAZOLE  (FLAGYL ) 500 MG tablet  2 times daily        02/20/24 2232             Note:  This document was prepared using Dragon voice recognition software and may include unintentional dictation errors.    May Sparks, PA-C 02/24/24 Tempie Fee    Marylynn Soho, MD 03/07/24 1501

## 2024-02-20 NOTE — Discharge Instructions (Signed)
 Take the prescription med as directed.

## 2024-03-22 ENCOUNTER — Other Ambulatory Visit: Payer: Self-pay

## 2024-03-22 ENCOUNTER — Emergency Department
Admission: EM | Admit: 2024-03-22 | Discharge: 2024-03-22 | Disposition: A | Discharge status: Home / Self Care | Payer: MEDICAID

## 2024-03-22 ENCOUNTER — Encounter: Payer: Self-pay | Admitting: Emergency Medicine

## 2024-03-22 DIAGNOSIS — R102 Pelvic and perineal pain: Secondary | ICD-10-CM | POA: Diagnosis present

## 2024-03-22 DIAGNOSIS — N898 Other specified noninflammatory disorders of vagina: Secondary | ICD-10-CM | POA: Insufficient documentation

## 2024-03-22 LAB — POC URINE PREG, ED: Preg Test, Ur: NEGATIVE

## 2024-03-22 LAB — URINALYSIS, ROUTINE W REFLEX MICROSCOPIC
Bilirubin Urine: NEGATIVE
Glucose, UA: NEGATIVE mg/dL
Hgb urine dipstick: NEGATIVE
Ketones, ur: 5 mg/dL — AB
Nitrite: NEGATIVE
Protein, ur: NEGATIVE mg/dL
Specific Gravity, Urine: 1.035 — ABNORMAL HIGH (ref 1.005–1.030)
pH: 5 (ref 5.0–8.0)

## 2024-03-22 LAB — CHLAMYDIA/NGC RT PCR (ARMC ONLY)
Chlamydia Tr: NOT DETECTED
N gonorrhoeae: NOT DETECTED

## 2024-03-22 LAB — WET PREP, GENITAL
Clue Cells Wet Prep HPF POC: NONE SEEN
Sperm: NONE SEEN
Trich, Wet Prep: NONE SEEN
WBC, Wet Prep HPF POC: <10 (ref <10)
Yeast Wet Prep HPF POC: NONE SEEN

## 2024-03-22 MED ORDER — ACETAMINOPHEN 500 MG PO TABS
1000.0000 mg | ORAL_TABLET | Freq: Once | ORAL | Status: AC
  Administered 2024-03-22: 1000 mg via ORAL
  Filled 2024-03-22: qty 2

## 2024-03-22 MED ORDER — METRONIDAZOLE 500 MG PO TABS: 500.0000 mg | ORAL_TABLET | Freq: Two times a day (BID) | ORAL | 0 refills | Status: AC

## 2024-03-22 MED ORDER — METRONIDAZOLE 500 MG PO TABS
500.0000 mg | ORAL_TABLET | Freq: Once | ORAL | Status: AC
  Administered 2024-03-22: 500 mg via ORAL
  Filled 2024-03-22: qty 1

## 2024-03-22 NOTE — Discharge Instructions (Addendum)
 Your labs are normal. You are being treated for for BV. Follow-up with the local GYN clinic for further care.

## 2024-03-22 NOTE — ED Provider Notes (Signed)
 [facility] Emergency Department Provider Note     Event Date/Time   First MD Initiated Contact with Patient 03/22/24 2149     (approximate)   History   Vaginal Discharge and Pelvic Pain   HPI  Penny Zimmerman is a 38 y.o. female presents to the ED accompanied by her significant other, for evaluation of vaginal discharge and pelvic pain since Sunday.  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  She denies any associated fevers, chills, or sweats.  No reports of any bilateral bowel changes, hematuria, or urinary frequency.  She does endorse a copious, malodorous vaginal discharge at this time.  Physical Exam   Triage Vital Signs: ED Triage Vitals  Encounter Vitals Group     BP 03/22/24 2003 103/86     Girls Systolic BP Percentile --      Girls Diastolic BP Percentile --      Boys Systolic BP Percentile --      Boys Diastolic BP Percentile --      Pulse Rate 03/22/24 2003 79     Resp 03/22/24 2003 19     Temp 03/22/24 2003 98 F (36.7 C)     Temp Source 03/22/24 2003 Oral     SpO2 03/22/24 2003 100 %     Weight 03/22/24 2003 200 lb (90.7 kg)     Height 03/22/24 2003 5' 9 (1.753 m)     Head Circumference --      Peak Flow --      Pain Score 03/22/24 2007 10     Pain Loc --      Pain Education --      Exclude from Growth Chart --     Most recent vital signs: Vitals:   03/22/24 2003  BP: 103/86  Pulse: 79  Resp: 19  Temp: 98 F (36.7 C)  SpO2: 100%    General Awake, no distress.  HEENT NCAT. PERRL. EOMI. No rhinorrhea. Mucous membranes are moist.  CV:  Good peripheral perfusion.  RESP:  Normal effort.  ABD:  No distention.  GYN:  Deferred.  Patient- collected swabs   ED Results / Procedures / Treatments   Labs (all labs ordered are listed, but only abnormal results are displayed) Labs Reviewed  URINALYSIS, ROUTINE W REFLEX MICROSCOPIC - Abnormal; Notable for the following  components:      Result Value   Color, Urine YELLOW (*)    APPearance HAZY (*)    Specific Gravity, Urine 1.035 (*)    Ketones, ur 5 (*)    Leukocytes,Ua SMALL (*)    Bacteria, UA RARE (*)    All other components within normal limits  WET PREP, GENITAL  CHLAMYDIA/NGC RT PCR ([facility] ONLY)            POC URINE PREG, ED     EKG   RADIOLOGY   No results found.   PROCEDURES:  Critical Care performed: No  Procedures   MEDICATIONS ORDERED IN ED: Medications  acetaminophen  (TYLENOL ) tablet 1,000 mg (1,000 mg Oral Given 03/22/24 2016)  metroNIDAZOLE  (FLAGYL ) tablet 500 mg (500 mg Oral Given 03/22/24 2246)     IMPRESSION / MDM / ASSESSMENT AND PLAN / ED COURSE  I reviewed the triage vital signs and the nursing notes.  Differential diagnosis includes, but is not limited to, ovarian cyst, ovarian torsion, acute appendicitis, diverticulitis, urinary tract infection/pyelonephritis, bowel obstruction, colitis, renal colic, gastroenteritis, hernia, fibroids, endometriosis, pregnancy related pain including ectopic pregnancy, etc.  Patient's presentation is most consistent with acute complicated illness / injury requiring diagnostic workup.  Patient's diagnosis is consistent with vaginitis and vaginal discharge.  With reassuring exam and workup at this time.  Her wet prep, and GC/NG swabs are negative at this time.  No UA evidence of any UTI.  Patient still is endorsing copious discharge and requesting treatment at this time.  Discussed options including MetroGel  versus Cleocin  versus oral Flagyl .  Patient would prefer the oral Flagyl  at this time.  Patient will be discharged home with prescriptions for Flagyl . Patient is to follow up with a local GYN clinic as suggested, as needed or otherwise directed. Patient is given ED precautions to return to the ED for any worsening or new symptoms.   FINAL CLINICAL IMPRESSION(S) / ED DIAGNOSES   Final diagnoses:   Vaginal discharge     Rx / DC Orders   ED Discharge Orders          Ordered    metroNIDAZOLE  (FLAGYL ) 500 MG tablet  2 times daily        03/22/24 2239             Note:  This document was prepared using Dragon voice recognition software and may include unintentional dictation errors.    Loyd Candida LULLA Aldona, PA-C 03/22/24 2316    Clarine Ozell LABOR, MD 03/22/24 [phone]

## 2024-03-22 NOTE — ED Triage Notes (Signed)
 Pt in via POV, reports vaginal discharge and pelvic pain since Sunday.  Denies any concern for STD's, reports hx of bacterial vaginosis.  Ambulatory to triage, NAD noted at this time.
